# Patient Record
Sex: Female | Born: 2000 | Race: Black or African American | Hispanic: No | Smoking: Never smoker
Health system: Southern US, Community
[De-identification: ages and names within clinical notes are randomized; demographics above are authoritative.]

## PROBLEM LIST (undated history)

## (undated) ENCOUNTER — Inpatient Hospital Stay (HOSPITAL_COMMUNITY): Payer: Self-pay

## (undated) DIAGNOSIS — K219 Gastro-esophageal reflux disease without esophagitis: Secondary | ICD-10-CM

## (undated) DIAGNOSIS — J45909 Unspecified asthma, uncomplicated: Secondary | ICD-10-CM

## (undated) DIAGNOSIS — Z789 Other specified health status: Secondary | ICD-10-CM

## (undated) HISTORY — DX: Unspecified asthma, uncomplicated: J45.909

## (undated) HISTORY — PX: NO PAST SURGERIES: SHX2092

---
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
## 2020-02-07 ENCOUNTER — Other Ambulatory Visit: Payer: Medicaid Other

## 2020-02-07 ENCOUNTER — Other Ambulatory Visit: Payer: Self-pay | Admitting: Critical Care Medicine

## 2020-02-07 DIAGNOSIS — Z20822 Contact with and (suspected) exposure to covid-19: Secondary | ICD-10-CM

## 2020-02-09 ENCOUNTER — Telehealth: Payer: Self-pay | Admitting: *Deleted

## 2020-02-09 LAB — SARS-COV-2, NAA 2 DAY TAT

## 2020-02-09 LAB — NOVEL CORONAVIRUS, NAA: SARS-CoV-2, NAA: NOT DETECTED

## 2020-02-09 NOTE — Telephone Encounter (Signed)
Mother calling for COVID test result- notified negative. Duplicate chart- will request merge

## 2020-08-25 IMAGING — CT CT ABD-PELV W/ CM
2 of 4 series · 16 of 46 positions shown, 18 images · IV contrast (APPLIED)
Comparison: None.

CLINICAL DATA: Abdominal pain and non bilious vomiting

EXAM:
CT ABDOMEN AND PELVIS WITH CONTRAST
TECHNIQUE: Multidetector CT imaging of the abdomen and pelvis was performed
using the standard protocol following bolus administration of
intravenous contrast.
CONTRAST:  80mL OMNIPAQUE IOHEXOL 300 MG/ML  SOLN

[Series 3: abdomen 5.0 · axial · 0.73mm/px · z∈[+838,+1273]mm · 13 of 99 slices shown, 15 images]
[im 6/99  soft-tissue]
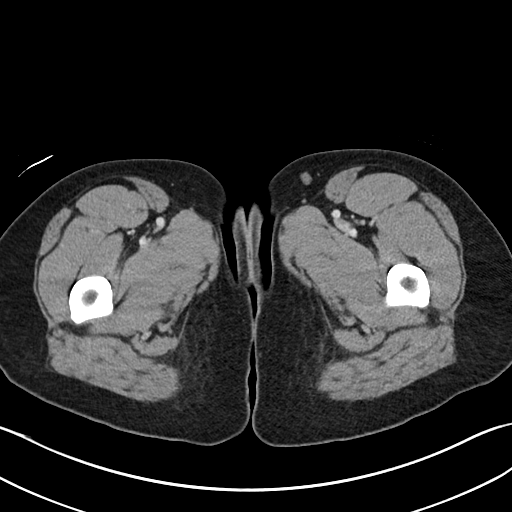
[im 6/99  bone]
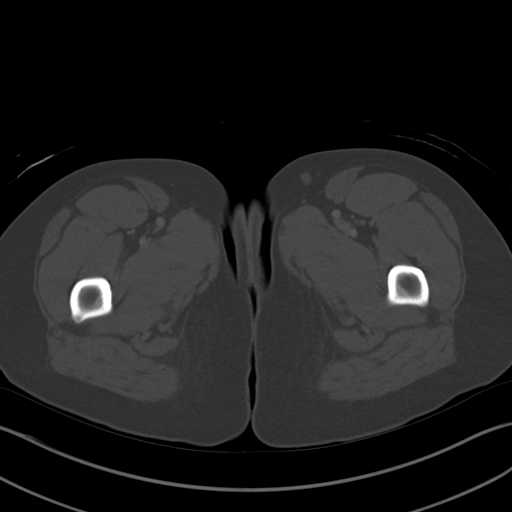
[im 16/99  soft-tissue]
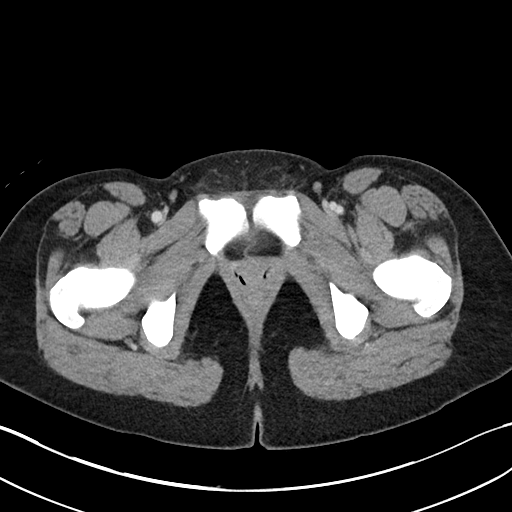
[im 21/99  soft-tissue]
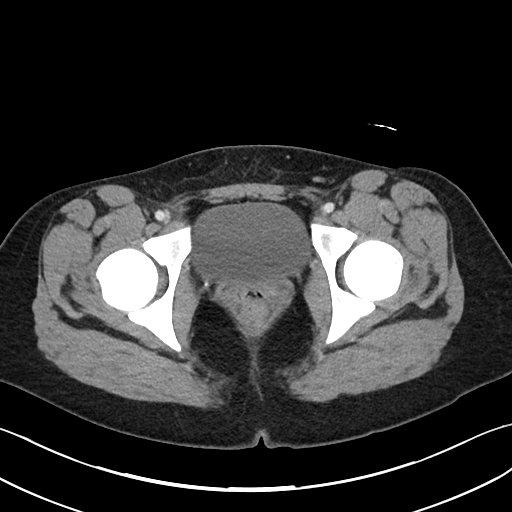
[im 26/99  soft-tissue]
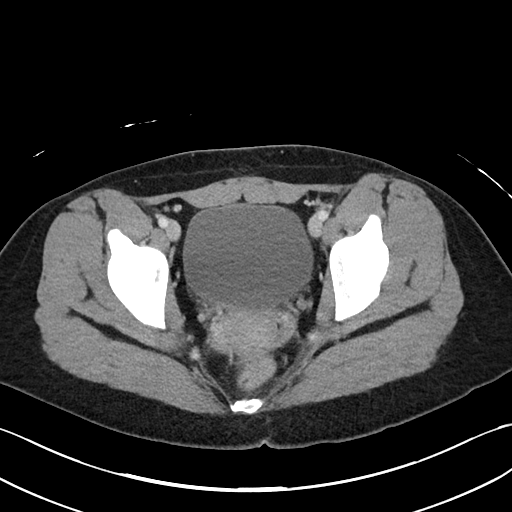
[im 37/99  soft-tissue]
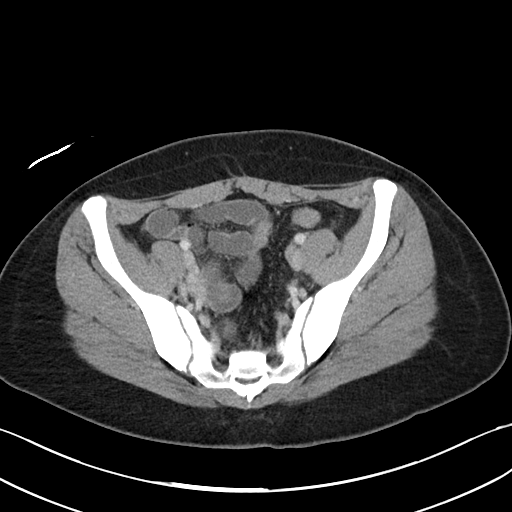
[im 42/99  soft-tissue]
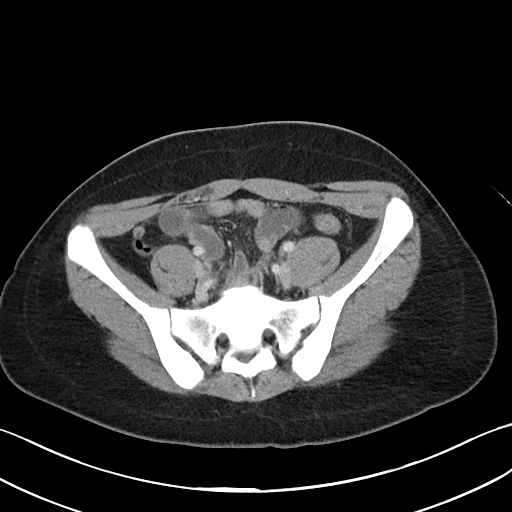
[im 52/99  soft-tissue]
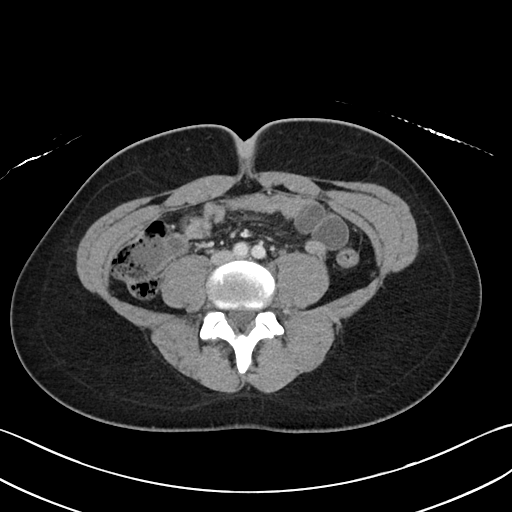
[im 57/99  soft-tissue]
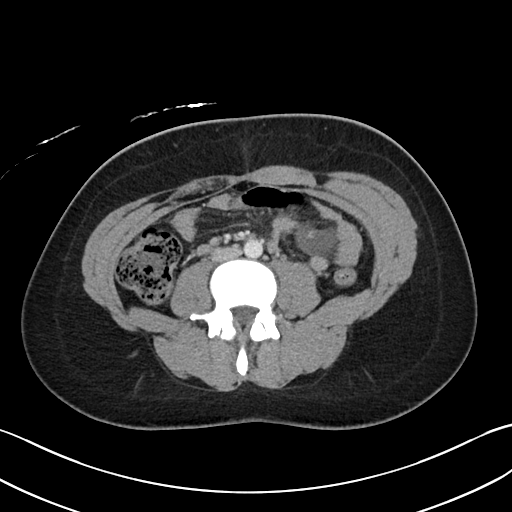
[im 62/99  soft-tissue]
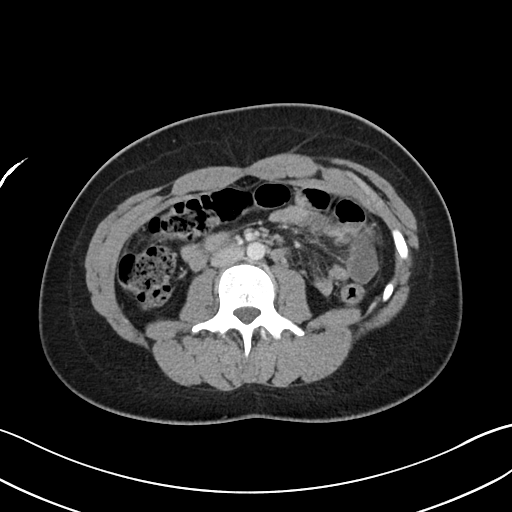
[im 62/99  bone]
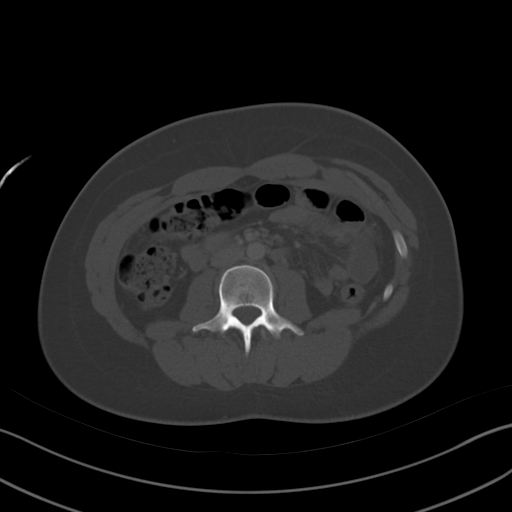
[im 73/99  soft-tissue]
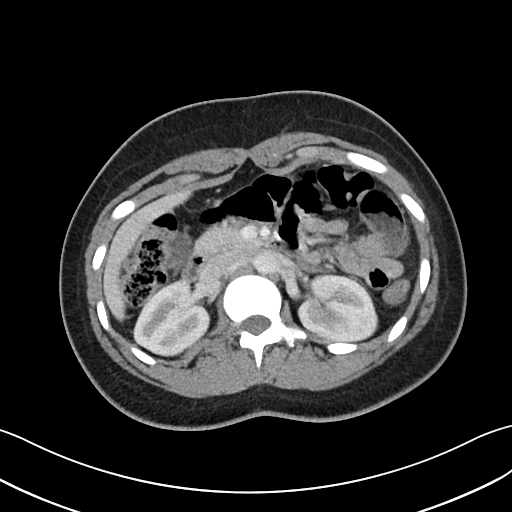
[im 78/99  soft-tissue]
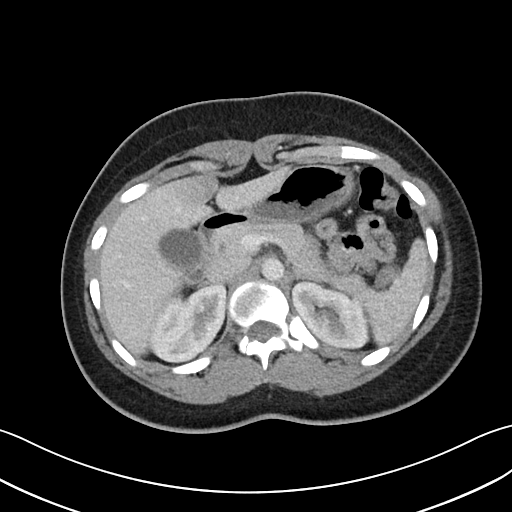
[im 83/99  soft-tissue]
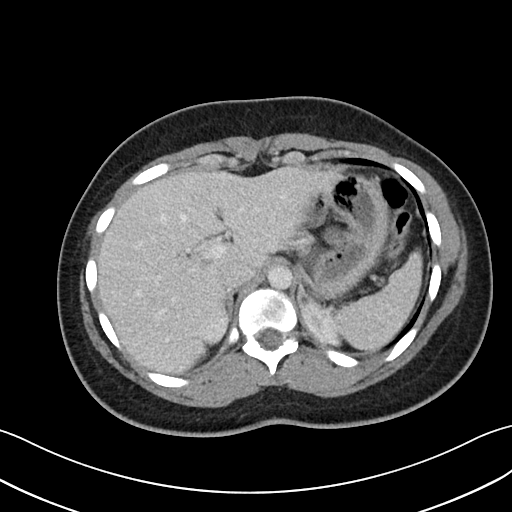
[im 93/99  soft-tissue]
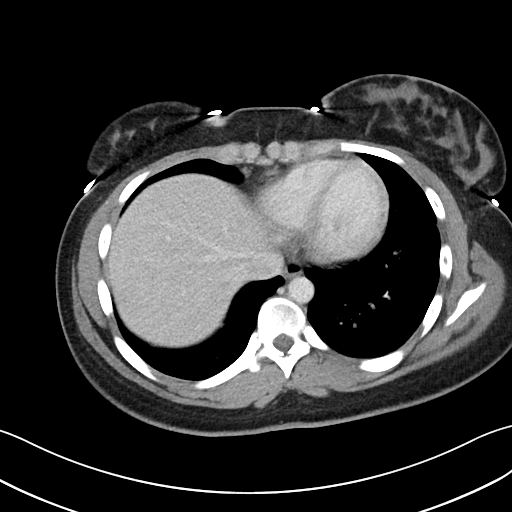

[Series 6: abdomen 3.0 mpr cor · coronal · 0.77mm/px · 3 of 93 slices shown]
[im 31/93  soft-tissue]
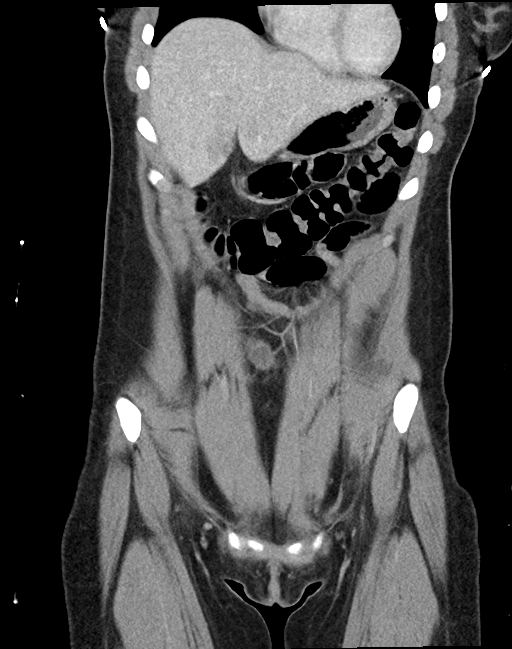
[im 41/93  soft-tissue]
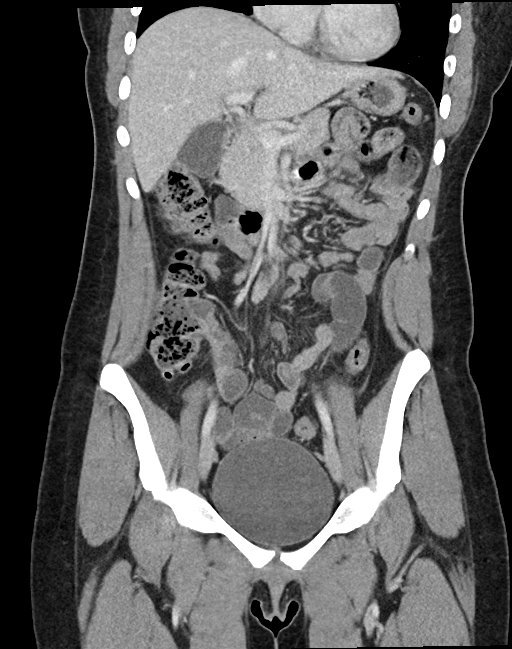
[im 52/93  soft-tissue]
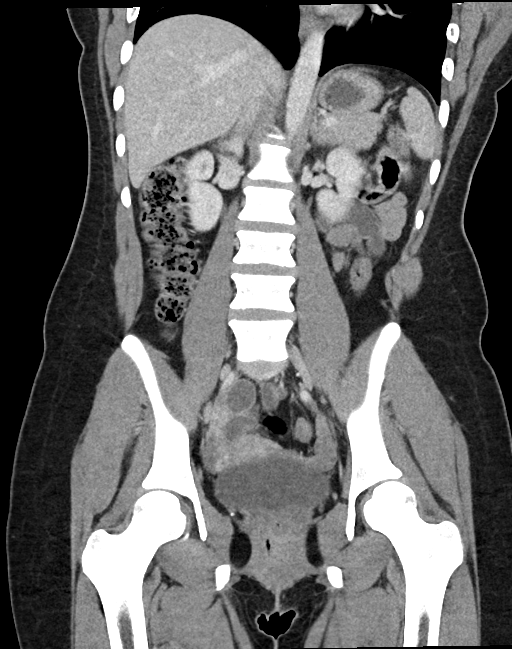

[16 of 46 positions shown; findings below may reference images not displayed]

FINDINGS: LOWER CHEST: Normal.

HEPATOBILIARY: Normal hepatic contours. No intra- or extrahepatic
biliary dilatation. Normal gallbladder.

PANCREAS: Normal pancreas. No ductal dilatation or peripancreatic
fluid collection.

SPLEEN: Normal.

ADRENALS/URINARY TRACT: The adrenal glands are normal. No
hydronephrosis, nephroureterolithiasis or solid renal mass. The
urinary bladder is normal for degree of distention

STOMACH/BOWEL: There is no hiatal hernia. Normal duodenal course and
caliber. No small bowel dilatation or inflammation. No focal colonic
abnormality. Normal appendix.

VASCULAR/LYMPHATIC: Normal course and caliber of the major abdominal
vessels. No abdominal or pelvic lymphadenopathy.

REPRODUCTIVE: Normal uterus and ovaries.

MUSCULOSKELETAL. Unilateral right L5 pars interarticularis defect.

OTHER: None.
IMPRESSION: 1. No acute abnormality of the abdomen or pelvis.
2. Unilateral right L5 pars interarticularis defect.

## 2023-04-16 DIAGNOSIS — N912 Amenorrhea, unspecified: Secondary | ICD-10-CM | POA: Diagnosis not present

## 2023-05-10 DIAGNOSIS — Z3200 Encounter for pregnancy test, result unknown: Secondary | ICD-10-CM | POA: Diagnosis not present

## 2023-05-11 ENCOUNTER — Telehealth: Payer: Self-pay | Admitting: Family Medicine

## 2023-05-11 NOTE — Telephone Encounter (Signed)
Patient called in requesting information on [employer],  she went to her OB yesterday and she will have them to fax over her [employer]. She just want to know the criteria to be apart of the program.

## 2023-05-31 ENCOUNTER — Inpatient Hospital Stay (HOSPITAL_COMMUNITY): Payer: Commercial Managed Care - PPO

## 2023-05-31 ENCOUNTER — Inpatient Hospital Stay (HOSPITAL_COMMUNITY)
Admission: AD | Admit: 2023-05-31 | Discharge: 2023-05-31 | Disposition: A | Discharge status: Home / Self Care | Payer: Commercial Managed Care - PPO | Attending: Obstetrics and Gynecology | Admitting: Obstetrics and Gynecology

## 2023-05-31 ENCOUNTER — Encounter (HOSPITAL_COMMUNITY): Payer: Self-pay | Admitting: Obstetrics and Gynecology

## 2023-05-31 ENCOUNTER — Other Ambulatory Visit: Payer: Self-pay

## 2023-05-31 DIAGNOSIS — O209 Hemorrhage in early pregnancy, unspecified: Secondary | ICD-10-CM | POA: Diagnosis not present

## 2023-05-31 DIAGNOSIS — Z3A11 11 weeks gestation of pregnancy: Secondary | ICD-10-CM | POA: Insufficient documentation

## 2023-05-31 DIAGNOSIS — R509 Fever, unspecified: Secondary | ICD-10-CM | POA: Diagnosis present

## 2023-05-31 DIAGNOSIS — R0781 Pleurodynia: Secondary | ICD-10-CM | POA: Diagnosis not present

## 2023-05-31 DIAGNOSIS — O99511 Diseases of the respiratory system complicating pregnancy, first trimester: Secondary | ICD-10-CM | POA: Diagnosis not present

## 2023-05-31 DIAGNOSIS — O26891 Other specified pregnancy related conditions, first trimester: Secondary | ICD-10-CM | POA: Insufficient documentation

## 2023-05-31 DIAGNOSIS — J101 Influenza due to other identified influenza virus with other respiratory manifestations: Secondary | ICD-10-CM | POA: Diagnosis not present

## 2023-05-31 HISTORY — DX: Other specified health status: Z78.9

## 2023-05-31 LAB — RESPIRATORY PANEL BY PCR

## 2023-05-31 LAB — RESP PANEL BY RT-PCR (RSV, FLU A&B, COVID)  RVPGX2
Influenza A by PCR: POSITIVE — AB
Influenza B by PCR: NEGATIVE
Resp Syncytial Virus by PCR: NEGATIVE
SARS Coronavirus 2 by RT PCR: NEGATIVE

## 2023-05-31 LAB — POCT PREGNANCY, URINE: Preg Test, Ur: POSITIVE — AB

## 2023-05-31 LAB — URINALYSIS, ROUTINE W REFLEX MICROSCOPIC
Bilirubin Urine: NEGATIVE
Glucose, UA: NEGATIVE mg/dL
Hgb urine dipstick: NEGATIVE
Ketones, ur: 5 mg/dL — AB
Leukocytes,Ua: NEGATIVE
Nitrite: NEGATIVE
Protein, ur: NEGATIVE mg/dL
Specific Gravity, Urine: 1.015 (ref 1.005–1.030)
pH: 5 (ref 5.0–8.0)

## 2023-05-31 MED ORDER — OSELTAMIVIR PHOSPHATE 75 MG PO CAPS: 75.0000 mg | ORAL_CAPSULE | Freq: Two times a day (BID) | ORAL | 0 refills | Status: AC

## 2023-05-31 MED ORDER — ACETAMINOPHEN 500 MG PO TABS
1000.0000 mg | ORAL_TABLET | Freq: Once | ORAL | Status: AC
  Administered 2023-05-31: 1000 mg via ORAL
  Filled 2023-05-31: qty 2

## 2023-05-31 MED ORDER — OSELTAMIVIR PHOSPHATE 75 MG PO CAPS: 75.0000 mg | ORAL_CAPSULE | Freq: Two times a day (BID) | ORAL | 0 refills | Status: DC

## 2023-05-31 NOTE — MAU Provider Note (Signed)
Chief Complaint: Fever  SUBJECTIVE HPI: Lauren Fernandez is a 22 y.o. G1P0 at [redacted]w[redacted]d by LMP who presents to maternity admissions reporting fever for two days with productive cough, pleuritic chest pain, malaise, myalgias and chills. Several sick contacts, works as Psychologist, occupational.  She denies vaginal bleeding, vaginal itching/burning, urinary symptoms, h/a, dizziness, n/v, or fever/chills.    HPI  Past Medical History:  Diagnosis Date   Medical history non-contributory    Past Surgical History:  Procedure Laterality Date   NO PAST SURGERIES     Social History   Socioeconomic History   Marital status: Significant Other    Spouse name: Not on file   Number of children: Not on file   Years of education: Not on file   Highest education level: Not on file  Occupational History   Not on file  Tobacco Use   Smoking status: Never   Smokeless tobacco: Never  Vaping Use   Vaping status: Former  Substance and Sexual Activity   Alcohol use: Not Currently   Drug use: Never   Sexual activity: Not on file  Other Topics Concern   Not on file  Social History Narrative   Not on file   Social Drivers of Health   Financial Resource Strain: Not on file  Food Insecurity: Not on file  Transportation Needs: Not on file  Physical Activity: Not on file  Stress: Not on file  Social Connections: Not on file  Intimate Partner Violence: Not on file   No current facility-administered medications on file prior to encounter.   Current Outpatient Medications on File Prior to Encounter  Medication Sig Dispense Refill   acetaminophen (TYLENOL) 650 MG CR tablet Take 650 mg by mouth every 8 (eight) hours as needed for pain.     Prenatal Vit-Fe Fumarate-FA (PRENATAL MULTIVITAMIN) TABS tablet Take 1 tablet by mouth daily at 12 noon.     doxycycline (VIBRAMYCIN) 100 MG capsule Take 1 capsule (100 mg total) by mouth 2 (two) times daily. (Patient not taking: Reported on 05/31/2023) 28 capsule  0   ibuprofen (ADVIL) 600 MG tablet Take 1 tablet (600 mg total) by mouth every 6 (six) hours as needed for fever or moderate pain. (Patient not taking: Reported on 05/31/2023) 30 tablet 0   ondansetron (ZOFRAN ODT) 4 MG disintegrating tablet Take 1 tablet (4 mg total) by mouth every 8 (eight) hours as needed for nausea or vomiting. (Patient not taking: Reported on 05/31/2023) 20 tablet 0   Pseudoeph-Doxylamine-DM-APAP (NYQUIL PO) Take by mouth. (Patient not taking: Reported on 05/31/2023)     No Known Allergies  I have reviewed patient's Past Medical Hx, Surgical Hx, Family Hx, Social Hx, medications and allergies.   ROS:  Review of Systems Review of Systems  Other systems negative   Physical Exam  Physical Exam Patient Vitals for the past 24 hrs:  BP Temp Temp src Pulse Resp SpO2 Height Weight  05/31/23 1206 -- 99.4 F (37.4 C) Oral -- (!) 105 -- -- --  05/31/23 1046 119/72 (!) 103.2 F (39.6 C) Oral (!) 106 20 98 % -- --  05/31/23 1039 -- -- -- -- -- -- 5\' 7"  (1.702 m) 82.5 kg   Constitutional: Well-developed, well-nourished female in no acute distress.  Cardiovascular: normal rate Respiratory: normal effort, CTAB with symmetric air entry  GI: Abd soft, non-tender. Pos BS x 4 MS: Extremities nontender, no edema, normal ROM Neurologic: Alert and oriented x 4.  GU: Neg CVAT.  LAB  RESULTS Results for orders placed or performed during the hospital encounter of 05/31/23 (from the past 24 hours)  Pregnancy, urine POC     Status: Abnormal   Collection Time: 05/31/23 10:53 AM  Result Value Ref Range   Preg Test, Ur POSITIVE (A) NEGATIVE  Resp panel by RT-PCR (RSV, Flu A&B, Covid) Anterior Nasal Swab     Status: Abnormal   Collection Time: 05/31/23 11:03 AM   Specimen: Anterior Nasal Swab  Result Value Ref Range   SARS Coronavirus 2 by RT PCR NEGATIVE NEGATIVE   Influenza A by PCR POSITIVE (A) NEGATIVE   Influenza B by PCR NEGATIVE NEGATIVE   Resp Syncytial Virus by PCR  NEGATIVE NEGATIVE  Urinalysis, Routine w reflex microscopic -Urine, Clean Catch     Status: Abnormal   Collection Time: 05/31/23 11:08 AM  Result Value Ref Range   Color, Urine YELLOW YELLOW   APPearance HAZY (A) CLEAR   Specific Gravity, Urine 1.015 1.005 - 1.030   pH 5.0 5.0 - 8.0   Glucose, UA NEGATIVE NEGATIVE mg/dL   Hgb urine dipstick NEGATIVE NEGATIVE   Bilirubin Urine NEGATIVE NEGATIVE   Ketones, ur 5 (A) NEGATIVE mg/dL   Protein, ur NEGATIVE NEGATIVE mg/dL   Nitrite NEGATIVE NEGATIVE   Leukocytes,Ua NEGATIVE NEGATIVE       IMAGING DG Chest Port 1V same Day Result Date: 05/31/2023 CLINICAL DATA:  22 year old female with pleuritic pain. EXAM: PORTABLE CHEST 1 VIEW COMPARISON:  Chest radiographs 07/06/2006. FINDINGS: Portable AP upright view at 1108 hours. Normal lung volumes and mediastinal contours. Visualized tracheal air column is within normal limits. Allowing for portable technique the lungs are clear. Negative visible bowel gas and osseous structures. IMPRESSION: Negative portable chest. Electronically Signed   By: Odessa Fleming M.D.   On: 05/31/2023 12:00    MAU Management/MDM: I have reviewed the triage vital signs and the nursing notes.   Pertinent labs & imaging results that were available during my care of the patient were reviewed by me and considered in my medical decision making (see chart for details).      I have reviewed her medical records including past results, notes and treatments. Medical, Surgical, and family history were reviewed.  Medications and recent lab tests were reviewed  Treatments in MAU included Tylenol, Respiratory panel swab, UA, UPT, CXR.   This bleeding/pain can represent a normal pregnancy with bleeding, spontaneous abortion or even an ectopic which can be life-threatening.  The process as listed above helps to determine which of these is present.  ASSESSMENT 1. [redacted] weeks gestation of pregnancy   2. Influenza A   Febrile on arrival, improved  with Tylenol. CXR negative on my review.  - Tylenol relived fever - Tamiflu sent to preferred pharmacy  PLAN Discharge home Return precautions provided Quarantine precautions provided  Pt stable at time of discharge. Encouraged to return here if she develops worsening of symptoms, increase in pain, fever, or other concerning symptoms.   Wyn Forster, MD FMOB Fellow, Faculty practice [facility] Center for [facility]  05/31/2023  12:10 PM

## 2023-05-31 NOTE — Discharge Instructions (Signed)

## 2023-05-31 NOTE — MAU Note (Signed)
Lauren Fernandez is a 22 y.o. at Unknown here in MAU reporting: she's had a fever for the past 2 days.  Reports she also has body aches, chills, productive cough, and chest pain when coughing.  States been taking Tylenol, last took one tablet around midnight.   Denies VB.   LMP: 03/11/2023 Onset of complaint: 2 days Pain score: 8 Vitals:   05/31/23 1046  BP: 119/72  Pulse: (!) 106  Resp: 20  Temp: (!) 103.2 F (39.6 C)  SpO2: 98%     FHT:NA Lab orders placed from triage: UPT

## 2023-06-02 NOTE — L&D Delivery Note (Signed)
 OB/GYN Faculty Practice Delivery Note  Lauren Fernandez is a 23 y.o. G1P0 s/p NSVD at [redacted]w[redacted]d. She was admitted for SROM.   ROM: 28h 27m with clear fluid GBS Status:  Negative/-- (07/02 1410) Maximum Maternal Temperature:  Temp (24hrs), Avg:98.3 F (36.8 C), Min:98 F (36.7 C), Max:98.4 F (36.9 C)    Labor Progress: Patient arrived at 3cm cm dilation and was induced with cytotec , and pitocin .   Delivery Date/Time: 12/13/2023 at 0905 Delivery: Called to room and patient was complete and pushing. Head delivered in LOP position. Loose nuchal cord present, pushed through. Shoulder and body delivered in usual fashion. Infant with spontaneous cry, placed on mother's abdomen, dried and stimulated. Cord clamped x 2 after 1-minute delay, and cut by FOB. Cord blood drawn. Placenta delivered spontaneously with gentle cord traction. Fundus firm with massage and Pitocin . Labia, perineum, vagina, and cervix inspected . Attending physician was present and supervised to the entire delivery.   Placenta: intact, sent to L&D Complications: none Lacerations: none  EBL: 100 Analgesia: epidural    Infant: APGAR (1 MIN): 9  APGAR (5 MINS): 9  APGAR (10 MINS):    Weight: pending  Chiquita Clover, MD  PGY-3 [facility] Family Medicine Resident 12/13/2023 10:20 AM

## 2023-06-03 ENCOUNTER — Telehealth: Payer: Commercial Managed Care - PPO

## 2023-06-03 DIAGNOSIS — Z3689 Encounter for other specified antenatal screening: Secondary | ICD-10-CM

## 2023-06-03 DIAGNOSIS — Z3490 Encounter for supervision of normal pregnancy, unspecified, unspecified trimester: Secondary | ICD-10-CM | POA: Insufficient documentation

## 2023-06-03 NOTE — Patient Instructions (Signed)
 Options for Doula Care in the [place]  As you review your birthing options, consider having a birth doula. A doula is trained to provide support before, during and just after you give birth. There are also postpartum doulas that help you adjust to new parenthood.  While doulas do not provide medical care, they do provide emotional, physical and educational support. A few months before your baby arrives, doulas can help answer questions, ease concerns and help you create and support your birthing plan.    Doulas can help reduce your stress and comfort you and your partner. They can help you cope with labor by helping you use breathing techniques, massage, creative labor positioning, essential oils and affirmations.   Studies show that the benefits of having a doula include:   A more positive birth experience  Fewer requests for pain-relief medication  Less likelihood of cesarean section, commonly called a c-section   Doulas are typically hired via a Advertising account planner between you and the doula. We are happy to provide a list of the most active doulas in the area, all of whom are credentialed by [facility] and will not count as a visitor at your birth.  There are several options for no-cost doula care at our hospital, including:  [facility] Volunteer Doula Program Every [employer] Program A Cure 4 Moms Doula Study (available only at [employer] for Women, [place], [place] and [facility] offices)  For more information on these programs or to receive a list of doulas active in our area, please email [email or web address]

## 2023-06-03 NOTE — Progress Notes (Signed)
 New OB Intake  I connected with Lauren Fernandez  on 06/03/23 at 2:35 by MyChart Video Visit and verified that I am speaking with the correct person using two identifiers. Nurse is located at [facility] and pt is located at home.  I discussed the limitations, risks, security and privacy concerns of performing an evaluation and management service by telephone and the availability of in person appointments. I also discussed with the patient that there may be a patient responsible charge related to this service. The patient expressed understanding and agreed to proceed.  I explained I am completing New OB Intake today. We discussed EDD of 12/16/2023, by Last Menstrual Period.She reports she had started care at [facility] and they had done US  and told her EDD 7/ 21/24. She reports she has asked them to send records. I verified we have not yet received records. She reports she will call them again.  Pt is G1P0. I reviewed her allergies, medications and Medical/Surgical/OB history.    Patient Active Problem List   Diagnosis Date Noted   Supervision of low-risk pregnancy 06/03/2023    Concerns addressed today  Delivery Plans Plans to deliver at [facility]. Discussed the nature of our practice with multiple providers including residents and students. Due to the size of the practice, the delivering provider may not be the same as those providing prenatal care.   Patient is interested in water  birth. Offered upcoming OB visit with CNM to discuss further.  MyChart/Babyscripts MyChart access verified. I explained pt will have some visits in office and some virtually. Babyscripts instructions given and order placed. Patient is a candidate for Optimized scheduling, added to sticky note.   Blood Pressure Cuff/Weight Scale Patient has private insurance; instructed to purchase blood pressure cuff and bring to first prenatal appt. Explained after first prenatal appt pt will check weekly and document in  Babyscripts. Patient does not have weight scale; patient may purchase if they desire to track weight weekly in Babyscripts.  Anatomy US  Explained first scheduled US  will be around 19 weeks. Anatomy US  scheduled for 07/23/23 at 0815.  Is patient a CenteringPregnancy candidate?  Declined Declined due to Enrolled in [facility]   Is patient a Mom+Baby Combined Care candidate?  Accepted   If accepted, confirm patient does not intend to move from the area for at least 12 months, then notify Mom+Baby staff  Interested in [place]? Yes, sent referral and doula dot phrase.   Is patient a candidate for Babyscripts Optimization? Yes, patient accepted    First visit review I reviewed new OB appt with patient. Explained pt will be seen by Nidia Delores PIETY at first visit. Discussed [place] genetic screening with patient. She would like both  Panorama and Horizon drawn with routine prenatal labs at new ob visit.    Last Pap No results found for: EDMON Rock Skip OBIE 06/03/2023  4:14 PM

## 2023-06-08 ENCOUNTER — Ambulatory Visit (INDEPENDENT_AMBULATORY_CARE_PROVIDER_SITE_OTHER): Payer: 59 | Admitting: Obstetrics and Gynecology

## 2023-06-08 ENCOUNTER — Encounter: Payer: Self-pay | Admitting: Family Medicine

## 2023-06-08 ENCOUNTER — Other Ambulatory Visit (HOSPITAL_COMMUNITY)
Admission: RE | Admit: 2023-06-08 | Discharge: 2023-06-08 | Disposition: A | Discharge status: Home / Self Care | Payer: Commercial Managed Care - PPO | Source: Ambulatory Visit | Attending: Obstetrics and Gynecology | Admitting: Obstetrics and Gynecology

## 2023-06-08 ENCOUNTER — Encounter: Payer: Self-pay | Admitting: Obstetrics and Gynecology

## 2023-06-08 VITALS — BP 97/68 | HR 65 | Wt 179.6 lb

## 2023-06-08 DIAGNOSIS — Z3492 Encounter for supervision of normal pregnancy, unspecified, second trimester: Secondary | ICD-10-CM | POA: Diagnosis not present

## 2023-06-08 DIAGNOSIS — Z3401 Encounter for supervision of normal first pregnancy, first trimester: Secondary | ICD-10-CM | POA: Diagnosis not present

## 2023-06-08 DIAGNOSIS — Z3A12 12 weeks gestation of pregnancy: Secondary | ICD-10-CM

## 2023-06-08 DIAGNOSIS — Z124 Encounter for screening for malignant neoplasm of cervix: Secondary | ICD-10-CM

## 2023-06-08 MED ORDER — ASPIRIN 81 MG PO TBEC: 81.0000 mg | DELAYED_RELEASE_TABLET | Freq: Every day | ORAL | 2 refills | Status: DC

## 2023-06-08 MED ORDER — VITAFOL GUMMIES 3.33-0.333-34.8 MG PO CHEW: 1.0000 | CHEWABLE_TABLET | Freq: Every day | ORAL | 5 refills | Status: AC

## 2023-06-08 NOTE — Progress Notes (Signed)
 INITIAL PRENATAL VISIT  Subjective:   Lauren Fernandez is being seen today for her first obstetrical visit.  She is at [redacted]w[redacted]d gestation by LMP Her obstetrical history is significant for  none . Relationship with FOB: significant other, living together. Patient does intend to breast feed. Pregnancy history fully reviewed.  Patient reports no complaints.  Objective:    Obstetric History OB History  Gravida Para Term Preterm AB Living  1       SAB IAB Ectopic Multiple Live Births          # Outcome Date GA Lbr Len/2nd Weight Sex Type Anes PTL Lv  1 Current             Past Medical History:  Diagnosis Date   Asthma    as a child   Chlamydia 2018   Gonorrhea 2018   Medical history non-contributory     Past Surgical History:  Procedure Laterality Date   NO PAST SURGERIES      Current Outpatient Medications on File Prior to Visit  Medication Sig Dispense Refill   Prenatal Vit-Fe Fumarate-FA (PRENATAL MULTIVITAMIN) TABS tablet Take 1 tablet by mouth daily at 12 noon.     acetaminophen  (TYLENOL ) 650 MG CR tablet Take 650 mg by mouth every 8 (eight) hours as needed for pain. (Patient not taking: Reported on 06/08/2023)     ondansetron  (ZOFRAN  ODT) 4 MG disintegrating tablet Take 1 tablet (4 mg total) by mouth every 8 (eight) hours as needed for nausea or vomiting. (Patient not taking: Reported on 06/08/2023) 20 tablet 0   Pseudoeph-Doxylamine-DM-APAP (NYQUIL PO) Take by mouth. (Patient not taking: Reported on 05/31/2023)     No current facility-administered medications on file prior to visit.    No Known Allergies  Social History:  reports that she has never smoked. She has never used smokeless tobacco. She reports that she does not currently use alcohol. She reports that she does not use drugs.  History reviewed. No pertinent family history.  The following portions of the patient's history were reviewed and updated as appropriate: allergies, current medications, past family  history, past medical history, past social history, past surgical history and problem list.  Review of Systems Review of Systems  All other systems reviewed and are negative.    Physical Exam:  BP 97/68   Pulse 65   Wt 179 lb 9.6 oz (81.5 kg)   LMP 03/11/2023   BMI 28.13 kg/m  CONSTITUTIONAL: Well-developed, well-nourished female in no acute distress.  HENT:  Normocephalic, atraumatic.   EYES: Conjunctivae normal.  NECK: Normal range of motion, supple SKIN: Skin is warm and dry MUSCULOSKELETAL: Normal range of motion NEUROLOGIC: Alert and oriented  PSYCHIATRIC: Normal mood and affect. Normal behavior. Normal judgment and thought content. CARDIOVASCULAR: Normal heart rate noted RESPIRATORY: normal effort ABDOMEN: Soft PELVIC: Normal appearing external genitalia; normal appearing vaginal mucosa and cervix.  No abnormal discharge noted.  Pap smear obtained.  Normal uterine size, no other palpable masses, no uterine or adnexal tenderness.  Fetal Heart Rate (bpm): 156   Movement: Absent       Assessment:    Pregnancy: G1P0   1. Encounter for supervision of low-risk pregnancy in second trimester (Primary) BP and FHR normal Doing well overall  - Hemoglobin A1c - CBC/D/Plt+RPR+Rh+ABO+RubIgG... - Culture, OB Urine - Prenatal Vit-Fe Phos-FA-Omega (VITAFOL  GUMMIES) 3.33-0.333-34.8 MG CHEW; Chew 1 tablet by mouth daily.  Dispense: 90 tablet; Refill: 5  2. [redacted] weeks gestation of pregnancy Discussed  ASA recommendation in pregnancy, rx sent Interested in waterbirth, given information about class, and meeting with waterbirth provider, as well as staying low risk Given information about childbirth education options and doula, had a doula call her this morning as well Discussed babyscripts optimized scheduling   3. Cervical cancer screening  - Cytology - PAP( [place])  4. Encounter for supervision of low-risk first pregnancy in first trimester  - HORIZON Basic Panel -  PANORAMA PRENATAL TEST    Plan:     Initial labs drawn. Prenatal vitamins. Problem list reviewed and updated. Reviewed in detail the nature of the practice with collaborative care between  Genetic screening discussed: NIPS/First trimester screen/Quad/AFP ordered. Role of ultrasound in pregnancy discussed; Anatomy US : ordered. Follow up in 4 weeks. Discussed clinic routines, schedule of care and testing, genetic screening options, involvement of students and residents under the direct supervision of APPs and doctors and presence of female providers. Pt verbalized understanding.  Return in 8 weeks for [facility] optimized schedule Future Appointments  Date Time Provider Department Center  07/23/2023  8:15 AM [facility] NURSE [facility]  07/23/2023  8:30 AM [facility] US5 [facility]      Delores Nidia CROME, FNP

## 2023-06-08 NOTE — Patient Instructions (Signed)
We highly recommend childbirth education to help you plan for labor and begin practicing coping skills (which will be needed with or without pain meds).  [place] Childbirth Education Options: Sign up by visiting [email or web address]  Childbirth ~ Self-Paced eClass (English and Spanish) This online class offers you the freedom to complete a childbirth education series in the comfort of your own home at your own pace.  Childbirth Class (In-Person 4-Week Series  or on Saturdays, Virtual 4-Week Series ~ [place]) This interactive in-person class series will help you and your partner prepare for your birth experience. Topics include: Labor & Birth, Comfort Measures, Breathing Techniques, Massage, Medical Interventions, Pain Management Options, Cesarean Birth, Postpartum Care, and Newborn Care  Comfort Techniques for Labor ~ In-Person Class ([place]) This interactive class is designed for parents-to-be who want to learn & practice hands-on skills to help relieve some of the discomfort of labor and encourage their babies to rotate toward the best position for birth. Moms and their partners will be able to try a variety of labor positions with birth balls and rebozos as well as practice breathing, relaxation, and visualization techniques.  Natural Childbirth Class (In-Person 5-Week Series, In-Person on Saturdays or Virtual 5-Week Series ~ [place]) This class series is designed for expectant parents who want to learn and practice natural methods of coping with the process of labor and childbirth.  Cesarean Birth Self-Paced eClass (English and Spanish) This online course provides comprehensive information you can trust as you prepare for a possible cesarean birth. In this class, you'll learn how to make your birth and recovery comfortable and joyful through instructive video clips, animations, and activities.  Waterbirth ~ Virtual Class Interested in a waterbirth? In addition to a consultation  with your credentialed waterbirth provider, this free, informational online class will help you discover whether waterbirth is the right fit for you. Not all obstetrical practices offer waterbirth, so check with your healthcare provider.  Tour (Self-Paced Video) - [facility] [place] [facility] Watch our 4 minute video tour of [facility] located in [place].   [place] Parenting Education Options:  Pregnancy 101 (Virtual) Congratulations on your pregnancy! This class is geared toward moms in their first trimester, but everyone is welcome. We are excited to guide you through all aspects of supporting a healthy pregnancy. You will learn what to expect at routine prenatal care appointments, common postpartum adjustments, basic infant safety, and breastfeeding.  Successful Partnering & Parenting ~ In-Person Workshop ([place]) This workshop inspires and equips partners of all economic levels, ages, and cultures to confidently care for their infants, support the birthing persons, and navigate their own transformations into new partners and parents. Learning activities are geared towards supporting partner, but moms are welcome to attend.  'Baby & Me' Parenting Group (Virtual on Wednesdays at 11am) Enjoy this time discussing newborn & infant parenting topics and family adjustment issues with other new parents in a relaxed environment. Each week brings a new speaker or baby-centered activity. This group offers support and connection to parents as they journey through the adjustments and struggles of that sometimes overwhelming first year after the birth of a child.  Baby Safety, CPR, & Choking Class ~ Virtual This life-saving information is meant to encourage parents as they learn important safety and prevention tips as well as infant CPR and relief of choking.  Breastfeeding Class (In-Person in [place] or Virtual) Families learn what to expect in the  first days and weeks of breastfeeding your   newborn. IF YOU ARE AN EMPLOYEE TAKING THIS CLASS FOR CREDIT, DO NOT register yourself. Please e-mail [email or web address].   Breastfeeding Self-Paced eClass (English & Spanish) Families learn what to expect in the first days and weeks of breastfeeding your newborn.  Caring for Baby ~ In-Person, Virtual or Self-Paced Class This in-person class is for both expectant and adoptive parents who want to learn and practice the most up-to-date newborn care for their babies. Focus is on birth through the first six weeks of life.  CPR & Choking Relief for Infants & Children ~ In-Person Class ([place]) This in-person course is designed for any parent, expectant parent, or adult who cares for infants or children. Participants learn and demonstrate cardiopulmonary resuscitation and choking relief procedures for both infants and children.  Grandparent Love ~ In-Person Class Grandparents will learn the most updated infant care and safety recommendations. They will discover ways to support their own children during the transition into the parenting role and receive tips on communicating with the new parents.  [place] Parenting Support Group Options:  Bereavement Grief Support Group (Pregnancy/Infant Loss) - Virtual This is an ongoing experience that meets once a month and is designed to help you honor the past, assist you in discovering tools to strengthen you today, and aid you in developing hope for the future.  Breastfeeding & Pumping Support Group (In-Person on Thursdays at 12pm or Virtual on Tuesdays at 5pm) Join us in-person each Thursday starting June 1st, 2023 at 12pm! This support group is free for all families looking for breastfeeding and/or pumping support.   Community-Based Childbirth Education Options:  [facility] Classes:  Childbirth education classes can help you get ready for a positive parenting experience. You  can also meet other expectant parents and get free stuff for your baby. Each class runs for five weeks on the same night and costs $45 for the mother-to-be and her support person. Medicaid covers the cost if you are eligible. Call [phone] to register.  [employer] [facility] [employer] offers a variety of programs for the [place] community and is another great way to get connected. Please go to [email or web address] for more information.  Childbirth With A Twist! Be informed of your options, get educated on birth, understand what your body is doing, learn how to cope, and have a lot of fun and laughs all while doing it either from the comfort of your couch OR in our cozy office and classroom space near the [place] airport. If you are taking a virtual class, then class is taught LIVE, so you can ask questions and receive answers in real-time from an experienced doula and childbirth educator.  This virtual childbirth education class will meet for five instruction times online.  Although we are based in , [place], this virtual class is open to anyone in the world. Please visit: [email or web address] for more information.  Books We Love: The Doula Guide to Childbirth by Ananda Lowe and Rachel Zimmerman The First-Time Parent's Childbirth Handbook by Dr. Stephanie [place], CNM The Birth Partner by Penny Simkin    Considering Waterbirth? Guide for patients at [facility] ([facility]) Why consider waterbirth? Gentle birth for babies  Less pain medicine used in labor  May allow for passive descent/less pushing  May reduce perineal tears  More mobility and instinctive maternal position changes  Increased maternal relaxation   Is waterbirth safe? What are the risks of infection, drowning or other complications? Infection:  Very low risk (3.7 % for tub vs   4.8% for bed)  7 in 8000 waterbirths with documented infection  Poorly cleaned equipment most  common cause  Slightly lower group B strep transmission rate  Drowning  Maternal:  Very low risk  Related to seizures or fainting  Newborn:  Very low risk. No evidence of increased risk of respiratory problems in multiple large studies  Physiological protection from breathing under water  Avoid underwater birth if there are any fetal complications  Once baby's head is out of the water, keep it out.  Birth complication  Some reports of cord trauma, but risk decreased by bringing baby to surface gradually  No evidence of increased risk of shoulder dystocia. Mothers can usually change positions faster in water than in a bed, possibly aiding the maneuvers to free the shoulder.   There are 2 things you MUST do to have a waterbirth with [facility]: Attend a waterbirth class at [facility]   3rd Wednesday of every month from 7-9 pm (virtual during COVID) Free Register online at [email or web address] or [email or web address] or by calling [phone] Bring us the certificate from the class to your prenatal appointment or send via MyChart Meet with a midwife at 36 weeks* to see if you can still plan a waterbirth and to sign the consent.   *We also recommend that you schedule as many of your prenatal visits with a midwife as possible.    Helpful information: You may want to bring a bathing suit top to the hospital to wear during labor but this is optional.  All other supplies are provided by the hospital. Please arrive at the hospital with signs of active labor, and do not wait at home until late in labor. It takes 45 min- 1 hour for fetal monitoring, and check in to your room to take place, plus transport and filling of the waterbirth tub.    Things that would prevent you from having a waterbirth: Premature, <37wks  Previous cesarean birth  Presence of thick meconium-stained fluid  Multiple gestation (Twins, triplets, etc.)  Uncontrolled diabetes or gestational  diabetes requiring medication  Hypertension diagnosed in pregnancy or preexisting hypertension (gestational hypertension, preeclampsia, or chronic hypertension) Fetal growth restriction (your baby measures less than 10th percentile on ultrasound) Heavy vaginal bleeding  Non-reassuring fetal heart rate  Active infection (MRSA, etc.). Group B Strep is NOT a contraindication for waterbirth.  If your labor has to be induced and induction method requires continuous monitoring of the baby's heart rate  Other risks/issues identified by your obstetrical provider   Please remember that birth is unpredictable. Under certain unforeseeable circumstances your provider may advise against giving birth in the tub. These decisions will be made on a case-by-case basis and with the safety of you and your baby as our highest priority.    Updated 09/03/21   

## 2023-06-09 ENCOUNTER — Encounter: Payer: Self-pay | Admitting: *Deleted

## 2023-06-09 LAB — CBC/D/PLT+RPR+RH+ABO+RUBIGG...
Antibody Screen: NEGATIVE
Basophils Absolute: 0 10*3/uL (ref 0.0–0.2)
Basos: 0 %
EOS (ABSOLUTE): 0.1 10*3/uL (ref 0.0–0.4)
Eos: 2 %
HCV Ab: NONREACTIVE
HIV Screen 4th Generation wRfx: NONREACTIVE
Hematocrit: 38.8 % (ref 34.0–46.6)
Hemoglobin: 12.7 g/dL (ref 11.1–15.9)
Hepatitis B Surface Ag: NEGATIVE
Immature Grans (Abs): 0 10*3/uL (ref 0.0–0.1)
Immature Granulocytes: 0 %
Lymphocytes Absolute: 2 10*3/uL (ref 0.7–3.1)
Lymphs: 38 %
MCH: 27.2 pg (ref 26.6–33.0)
MCHC: 32.7 g/dL (ref 31.5–35.7)
MCV: 83 fL (ref 79–97)
Monocytes Absolute: 0.5 10*3/uL (ref 0.1–0.9)
Monocytes: 9 %
Neutrophils Absolute: 2.6 10*3/uL (ref 1.4–7.0)
Neutrophils: 51 %
Platelets: 286 10*3/uL (ref 150–450)
RBC: 4.67 x10E6/uL (ref 3.77–5.28)
RDW: 12.1 % (ref 11.7–15.4)
RPR Ser Ql: NONREACTIVE
Rh Factor: POSITIVE
Rubella Antibodies, IGG: 2.35 {index} (ref >0.99)
WBC: 5.1 10*3/uL (ref 3.4–10.8)

## 2023-06-09 LAB — CYTOLOGY - PAP
Chlamydia: NEGATIVE
Comment: NEGATIVE
Comment: NEGATIVE
Comment: NORMAL
Diagnosis: NEGATIVE
Neisseria Gonorrhea: NEGATIVE
Trichomonas: NEGATIVE

## 2023-06-09 LAB — HEMOGLOBIN A1C
Est. average glucose Bld gHb Est-mCnc: 111 mg/dL
Hgb A1c MFr Bld: 5.5 % (ref 4.8–5.6)

## 2023-06-09 LAB — HCV INTERPRETATION

## 2023-06-15 LAB — PANORAMA PRENATAL TEST FULL PANEL:PANORAMA TEST PLUS 5 ADDITIONAL MICRODELETIONS: FETAL FRACTION: 7.1

## 2023-06-18 LAB — HORIZON CUSTOM: REPORT SUMMARY: POSITIVE — AB

## 2023-07-09 ENCOUNTER — Other Ambulatory Visit: Payer: Self-pay

## 2023-07-09 ENCOUNTER — Ambulatory Visit (INDEPENDENT_AMBULATORY_CARE_PROVIDER_SITE_OTHER): Payer: 59 | Admitting: Obstetrics and Gynecology

## 2023-07-09 VITALS — BP 117/75 | HR 90 | Wt 189.0 lb

## 2023-07-09 DIAGNOSIS — Z3492 Encounter for supervision of normal pregnancy, unspecified, second trimester: Secondary | ICD-10-CM | POA: Diagnosis not present

## 2023-07-09 DIAGNOSIS — D563 Thalassemia minor: Secondary | ICD-10-CM | POA: Insufficient documentation

## 2023-07-09 DIAGNOSIS — Z3A17 17 weeks gestation of pregnancy: Secondary | ICD-10-CM | POA: Diagnosis not present

## 2023-07-09 NOTE — Progress Notes (Signed)
   Subjective:  Lauren Fernandez is a 23 y.o. G1P0 at [redacted]w[redacted]d being seen today for ongoing prenatal care.  She is currently monitored for the following issues for this low-risk pregnancy and has Supervision of low-risk pregnancy and Alpha thalassemia silent carrier on their problem list.  Patient reports no complaints.  Contractions: Not present. Vag. Bleeding: None.  Movement: Present. Denies leaking of fluid.   The following portions of the patient's history were reviewed and updated as appropriate: allergies, current medications, past family history, past medical history, past social history, past surgical history and problem list. Problem list updated.  Objective:   Vitals:   07/09/23 0941  BP: 117/75  Pulse: 90  Weight: 189 lb (85.7 kg)    Fetal Status: Fetal Heart Rate (bpm): 140   Movement: Present     General:  Alert, oriented and cooperative. Patient is in no acute distress.  Skin: Skin is warm and dry. No rash noted.   Cardiovascular: Normal heart rate noted  Respiratory: Normal respiratory effort, no problems with respiration noted  Abdomen: Soft, gravid, appropriate for gestational age. Pain/Pressure: Absent     Pelvic: Vag. Bleeding: None     Cervical exam deferred        Extremities: Normal range of motion.  Edema: None  Mental Status: Normal mood and affect. Normal behavior. Normal judgment and thought content.   Urinalysis:      Assessment and Plan:  Pregnancy: G1P0 at [redacted]w[redacted]d  1. Encounter for supervision of low-risk pregnancy in second trimester BP and FHR normal Doing well, starting to feel somemovement    2. [redacted] weeks gestation of pregnancy (Primary) Discussed AFP today  Waterbirth information provided, will schedule with [place] at another visit  Anatomy scan 2/21  3. Alpha thalassemia silent carrier Discussed results, providing partner kit today    Preterm labor symptoms and general obstetric precautions including but not limited to vaginal bleeding,  contractions, leaking of fluid and fetal movement were reviewed in detail with the patient. Please refer to After Visit Summary for other counseling recommendations.  Return in about 4 weeks (around 08/06/2023) for Mom+baby comined care OB visit.  Future Appointments  Date Time Provider Department Center  07/23/2023  8:15 AM [facility] NURSE [facility]  07/23/2023  8:30 AM [facility]  08/06/2023  9:55 AM Cresenzo, Norleen GAILS, MD [facility]    Delores Nidia CROME, FNP

## 2023-07-09 NOTE — Patient Instructions (Addendum)
 Considering Waterbirth? Guide for patients at [facility]) Why consider waterbirth? Gentle birth for babies  Less pain medicine used in labor  May allow for passive descent/less pushing  May reduce perineal tears  More mobility and instinctive maternal position changes  Increased maternal relaxation   Is waterbirth safe? What are the risks of infection, drowning or other complications? Infection:  Very low risk (3.7 % for tub vs 4.8% for bed)  7 in 8000 waterbirths with documented infection  Poorly cleaned equipment most common cause  Slightly lower group B strep transmission rate  Drowning  Maternal:  Very low risk  Related to seizures or fainting  Newborn:  Very low risk. No evidence of increased risk of respiratory problems in multiple large studies  Physiological protection from breathing under water   Avoid underwater birth if there are any fetal complications  Once baby's head is out of the water , keep it out.  Birth complication  Some reports of cord trauma, but risk decreased by bringing baby to surface gradually  No evidence of increased risk of shoulder dystocia. Mothers can usually change positions faster in water  than in a bed, possibly aiding the maneuvers to free the shoulder.   There are 2 things you MUST do to have a waterbirth with [facility]: Attend a waterbirth class at [facility] [employer] [facility] at [facility]   3rd Wednesday of every month from 7-9 pm (virtual during COVID) [employer] at [email or web address] or [email or web address] or by calling [phone] Bring us  the certificate from the class to your prenatal appointment or send via MyChart Meet with a midwife at 36 weeks* to see if you can still plan a waterbirth and to sign the consent.   *We also recommend that you schedule as many of your prenatal visits with a midwife as possible.    Helpful information: You may want to bring a bathing suit top to the hospital  to wear during labor but this is optional.  All other supplies are provided by the hospital. Please arrive at the hospital with signs of active labor, and do not wait at home until late in labor. It takes 45 min- 1 hour for fetal monitoring, and check in to your room to take place, plus transport and filling of the waterbirth tub.    Things that would prevent you from having a waterbirth: Premature, <37wks  Previous cesarean birth  Presence of thick meconium-stained fluid  Multiple gestation (Twins, triplets, etc.)  Uncontrolled diabetes or gestational diabetes requiring medication  Hypertension diagnosed in pregnancy or preexisting hypertension (gestational hypertension, preeclampsia, or chronic hypertension) Fetal growth restriction (your baby measures less than 10th percentile on ultrasound) Heavy vaginal bleeding  Non-reassuring fetal heart rate  Active infection (MRSA, etc.). Group B Strep is NOT a contraindication for waterbirth.  If your labor has to be induced and induction method requires continuous monitoring of the baby's heart rate  Other risks/issues identified by your obstetrical provider   Please remember that birth is unpredictable. Under certain unforeseeable circumstances your provider may advise against giving birth in the tub. These decisions will be made on a case-by-case basis and with the safety of you and your baby as our highest priority.    Updated 09/03/21 We highly recommend childbirth education to help you plan for labor and begin practicing coping skills (which will be needed with or without pain meds).  [place] Childbirth Education Options: Sign up by visiting [email or web address]  Childbirth ~ Self-Paced eClass (English and  Spanish) This online class offers you the freedom to complete a childbirth education series in the comfort of your own home at your own pace.  Childbirth Class (In-Person 4-Week Series  or on Saturdays, Virtual 4-Week Series ~  [place]) This interactive in-person class series will help you and your partner prepare for your birth experience. Topics include: Labor & Birth, Comfort Measures, Breathing Techniques, Massage, Medical Interventions, Pain Management Options, Cesarean Birth, Postpartum Care, and Newborn Care  Comfort Techniques for Labor ~ In-Person Class [facility]) This interactive class is designed for parents-to-be who want to learn & practice hands-on skills to help relieve some of the discomfort of labor and encourage their babies to rotate toward the best position for birth. Moms and their partners will be able to try a variety of labor positions with birth balls and rebozos as well as practice breathing, relaxation, and visualization techniques.  Natural Childbirth Class (In-Person 5-Week Series, In-Person on Saturdays or Virtual 5-Week Series ~ [place]) This class series is designed for expectant parents who want to learn and practice natural methods of coping with the process of labor and childbirth.  Cesarean Birth Self-Paced eClass (English and Spanish) This online course provides comprehensive information you can trust as you prepare for a possible cesarean birth. In this class, you'll learn how to make your birth and recovery comfortable and joyful through instructive video clips, animations, and activities.  Waterbirth ~ Airline Pilot Interested in a waterbirth? In addition to a consultation with your credentialed waterbirth provider, this free, informational online class will help you discover whether waterbirth is the right fit for you. Not all obstetrical practices offer waterbirth, so check with your healthcare provider.  Tour Probation Officer) - [facility] [employer] our 4 minute video tour of [facility] located in [place].   [place] Parenting Education Options:  Pregnancy 101 (Virtual) Congratulations on your pregnancy!  This class is geared toward moms in their first trimester, but everyone is welcome. We are excited to guide you through all aspects of supporting a healthy pregnancy. You will learn what to expect at routine prenatal care appointments, common postpartum adjustments, basic infant safety, and breastfeeding.  Successful Partnering & Parenting ~ In-Person Workshop [facility]) This workshop inspires and equips partners of all economic levels, ages, and cultures to confidently care for their infants, support the birthing persons, and navigate their own transformations into new partners and parents. Learning activities are geared towards supporting partner, but moms are welcome to attend.  'Baby & Me' Parenting Group (Virtual on Wednesdays at 11am) Enjoy this time discussing newborn & infant parenting topics and family adjustment issues with other new parents in a relaxed environment. Each week brings a new speaker or baby-centered activity. This group offers support and connection to parents as they journey through the adjustments and struggles of that sometimes overwhelming first year after the birth of a child.  Baby Safety, CPR, & Choking Class ~ Virtual This life-saving information is meant to encourage parents as they learn important safety and prevention tips as well as infant CPR and relief of choking.  Breastfeeding Class (In-Person in [place] or [employer]) Families learn what to expect in the first days and weeks of breastfeeding your newborn. IF YOU ARE AN EMPLOYEE TAKING THIS CLASS FOR CREDIT, DO NOT register yourself. Please e-mail [email or web address].   Breastfeeding Self-Paced eClass (English & Spanish) Families learn what to expect in the first days and weeks of breastfeeding your newborn.  Caring  for Baby ~ In-Person, Virtual or Self-Paced Class This in-person class is for both expectant and adoptive parents who want to learn and practice the most up-to-date newborn care for their  babies. Focus is on birth through the first six weeks of life.  CPR & Choking Relief for Infants & Children ~ In-Person Class [facility]) This in-person course is designed for any parent, expectant parent, or adult who cares for infants or children. Participants learn and demonstrate cardiopulmonary resuscitation and choking relief procedures for both infants and children.  Grandparent Love ~ In-Person Class Grandparents will learn the most updated infant care and safety recommendations. They will discover ways to support their own children during the transition into the parenting role and receive tips on communicating with the new parents.  [place] Parenting Support Group Options:  Bereavement Grief Support Group (Pregnancy/Infant Loss) - Virtual This is an ongoing experience that meets once a month and is designed to help you honor the past, assist you in discovering tools to strengthen you today, and aid you in developing hope for the future.  Breastfeeding & Pumping Support Group (In-Person on Thursdays at 12pm or Virtual on Tuesdays at 5pm) Join us  in-person each Thursday starting June 1st, 2023 at 12pm! This support group is free for all families looking for breastfeeding and/or pumping support.   Community-Based Childbirth Education Options:  [facility] Department Classes:  Childbirth education classes can help you get ready for a positive parenting experience. You can also meet other expectant parents and get free stuff for your baby. Each class runs for five weeks on the same night and costs $45 for the mother-to-be and her support person. Medicaid covers the cost if you are eligible. Call [phone] to register.  [facility] [employer] offers a variety of programs for the [employer] and is another great way to get connected. Please go to [email or web address] for more information.  Childbirth With A Twist! Be informed of your options, get  educated on birth, understand what your body is doing, learn how to cope, and have a lot of fun and laughs all while doing it either from the comfort of your couch OR in our cozy office and classroom space near the [place] airport. If you are taking a virtual class, then class is taught LIVE, so you can ask questions and receive answers in real-time from an experienced doula and childbirth educator.  This virtual childbirth education class will meet for five instruction times online.  Although we are based in [place], [place], this virtual class is open to anyone in the world. Please visit: [email or web address] for more information.  Books We Love: The Doula Guide to Childbirth by Retia Cook and Vernell Donald The First-Time Parent's Childbirth Handbook by Dr. Corean Glatter, CNM The Birth Partner by [employer]  Options for Doula Care in the [place] Area  As you review your birthing options, consider having a birth doula. A doula is trained to provide support before, during and just after you give birth. There are also postpartum doulas that help you adjust to new parenthood.  While doulas do not provide medical care, they do provide emotional, physical and educational support. A few months before your baby arrives, doulas can help answer questions, ease concerns and help you create and support your birthing plan.    Doulas can help reduce your stress and comfort you and your partner. They can help you cope with labor by helping you use breathing techniques, massage, creative labor positioning,  essential oils and affirmations.   Studies show that the benefits of having a doula include:   A more positive birth experience  Fewer requests for pain-relief medication  Less likelihood of cesarean section, commonly called a c-section   Doulas are typically hired via a advertising account planner between you and the doula. We are happy to provide a list of the most active doulas in  the area, all of whom are credentialed by [facility] and will not count as a visitor at your birth.  There are several options for no-cost doula care at our hospital, including:  [facility] Volunteer Doula Program Every W.w[employer] Program A Cure 4 Moms Doula Study (available only at [employer] for Women, [place], [place] and [facility] offices)  For more information on these programs or to receive a list of doulas active in our area, please email [email or web address]

## 2023-07-11 LAB — AFP, SERUM, OPEN SPINA BIFIDA
AFP MoM: 1.12
AFP Value: 40.8 ng/mL
Gest. Age on Collection Date: 17.1 wk
Maternal Age At EDD: 22.5 a
OSBR Risk 1 IN: 10000
Test Results:: NEGATIVE
Weight: 189 [lb_av]

## 2023-07-23 ENCOUNTER — Ambulatory Visit: Payer: 59

## 2023-07-23 ENCOUNTER — Other Ambulatory Visit: Payer: Self-pay | Admitting: *Deleted

## 2023-07-23 ENCOUNTER — Ambulatory Visit: Payer: 59 | Attending: Obstetrics and Gynecology

## 2023-07-23 ENCOUNTER — Encounter: Payer: Self-pay | Admitting: *Deleted

## 2023-07-23 ENCOUNTER — Ambulatory Visit: Payer: 59 | Attending: Obstetrics and Gynecology | Admitting: Obstetrics

## 2023-07-23 VITALS — BP 113/54 | HR 53

## 2023-07-23 DIAGNOSIS — D563 Thalassemia minor: Secondary | ICD-10-CM

## 2023-07-23 DIAGNOSIS — O358XX0 Maternal care for other (suspected) fetal abnormality and damage, not applicable or unspecified: Secondary | ICD-10-CM | POA: Insufficient documentation

## 2023-07-23 DIAGNOSIS — Z3492 Encounter for supervision of normal pregnancy, unspecified, second trimester: Secondary | ICD-10-CM | POA: Insufficient documentation

## 2023-07-23 DIAGNOSIS — Z363 Encounter for antenatal screening for malformations: Secondary | ICD-10-CM | POA: Diagnosis not present

## 2023-07-23 DIAGNOSIS — Z3490 Encounter for supervision of normal pregnancy, unspecified, unspecified trimester: Secondary | ICD-10-CM | POA: Insufficient documentation

## 2023-07-23 DIAGNOSIS — O99012 Anemia complicating pregnancy, second trimester: Secondary | ICD-10-CM

## 2023-07-23 DIAGNOSIS — O280 Abnormal hematological finding on antenatal screening of mother: Secondary | ICD-10-CM

## 2023-07-23 DIAGNOSIS — Z3A19 19 weeks gestation of pregnancy: Secondary | ICD-10-CM

## 2023-07-23 DIAGNOSIS — Z3689 Encounter for other specified antenatal screening: Secondary | ICD-10-CM

## 2023-07-23 NOTE — Progress Notes (Signed)
 MFM Consult Note  Lauren Fernandez is currently at 19 weeks and 1 day.  She was seen for a detailed fetal anatomy scan.  She denies any significant past medical history and denies any problems in her current pregnancy.    She had a cell free DNA test earlier in her pregnancy which indicated a low risk for trisomy 19, 49, and 13. A female fetus is predicted.    Her Horizon test indicated that she is a silent carrier for alpha thalassemia.  She was informed that the fetal growth and amniotic fluid level were appropriate for her gestational age.   There were no obvious fetal anomalies noted on today's ultrasound exam.  However, today's exam was limited due to the fetal position.  The patient was informed that anomalies may be missed due to technical limitations. If the fetus is in a suboptimal position or maternal habitus is increased, visualization of the fetus in the maternal uterus may be impaired.  A follow-up exam was scheduled in 6 weeks to complete the views of the fetal anatomy and to assess the fetal growth.  The patient stated that all of her questions were answered today.  A total of 30 minutes was spent counseling and coordinating the care for this patient.  Greater than 50% of the time was spent in direct face-to-face contact.

## 2023-08-06 ENCOUNTER — Ambulatory Visit (INDEPENDENT_AMBULATORY_CARE_PROVIDER_SITE_OTHER): Payer: Commercial Managed Care - PPO | Admitting: Family Medicine

## 2023-08-06 ENCOUNTER — Other Ambulatory Visit: Payer: Self-pay

## 2023-08-06 VITALS — BP 106/71 | HR 78 | Wt 201.6 lb

## 2023-08-06 DIAGNOSIS — Z3492 Encounter for supervision of normal pregnancy, unspecified, second trimester: Secondary | ICD-10-CM | POA: Diagnosis not present

## 2023-08-06 DIAGNOSIS — D563 Thalassemia minor: Secondary | ICD-10-CM | POA: Diagnosis not present

## 2023-08-06 DIAGNOSIS — Z3A21 21 weeks gestation of pregnancy: Secondary | ICD-10-CM

## 2023-08-06 MED ORDER — FAMOTIDINE 40 MG PO TABS: 40.0000 mg | ORAL_TABLET | Freq: Every day | ORAL | 0 refills | Status: DC

## 2023-08-06 NOTE — Progress Notes (Signed)
   PRENATAL VISIT NOTE  Subjective:  Lauren Fernandez is a 23 y.o. G1P0 at [redacted]w[redacted]d being seen today for ongoing prenatal care.  She is currently monitored for the following issues for this low-risk pregnancy and has Supervision of low-risk pregnancy and Alpha thalassemia silent carrier on their problem list.  Patient reports no bleeding, no contractions, no cramping, and no leaking.  Contractions: Not present. Vag. Bleeding: None.  Movement: Present. Denies leaking of fluid.   The following portions of the patient's history were reviewed and updated as appropriate: allergies, current medications, past family history, past medical history, past social history, past surgical history and problem list.   Objective:   Vitals:   08/06/23 1005  BP: 106/71  Pulse: 78  Weight: 201 lb 9 oz (91.4 kg)    Fetal Status: Fetal Heart Rate (bpm): 143   Movement: Present     General:  Alert, oriented and cooperative. Patient is in no acute distress.  Skin: Skin is warm and dry. No rash noted.   Cardiovascular: Normal heart rate noted  Respiratory: Normal respiratory effort, no problems with respiration noted  Abdomen: Soft, gravid, appropriate for gestational age.  Pain/Pressure: Absent     Pelvic: Cervical exam deferred        Extremities: Normal range of motion.  Edema: None  Mental Status: Normal mood and affect. Normal behavior. Normal judgment and thought content.   Assessment and Plan:  Pregnancy: G1P0 at [redacted]w[redacted]d 1. Encounter for supervision of low-risk pregnancy in second trimester (Primary) FHR and BP appropriate today Patient having reflux so given Pepcid  2. Alpha thalassemia silent carrier Partner has testing kit.  Awaiting results  3. [redacted] weeks gestation of pregnancy Follow-up in 4 weeks  Preterm labor symptoms and general obstetric precautions including but not limited to vaginal bleeding, contractions, leaking of fluid and fetal movement were reviewed in detail with the  patient. Please refer to After Visit Summary for other counseling recommendations.   No follow-ups on file.  Future Appointments  Date Time Provider Department Center  09/03/2023  9:55 AM Venora Maples, MD [facility]  09/03/2023 11:30 AM [facility] US5 [facility]  09/29/2023  9:55 AM Nobie Putnam, Cyndi Lennert, MD [facility]    Celedonio Savage, MD

## 2023-09-03 ENCOUNTER — Ambulatory Visit: Attending: Obstetrics | Admitting: Obstetrics

## 2023-09-03 ENCOUNTER — Ambulatory Visit: Payer: 59 | Attending: Obstetrics

## 2023-09-03 ENCOUNTER — Encounter: Payer: Self-pay | Admitting: Family Medicine

## 2023-09-03 ENCOUNTER — Other Ambulatory Visit: Payer: Self-pay

## 2023-09-03 ENCOUNTER — Other Ambulatory Visit: Payer: Self-pay | Admitting: *Deleted

## 2023-09-03 ENCOUNTER — Ambulatory Visit (INDEPENDENT_AMBULATORY_CARE_PROVIDER_SITE_OTHER): Payer: 59 | Admitting: Family Medicine

## 2023-09-03 VITALS — BP 106/69 | HR 76 | Wt 214.3 lb

## 2023-09-03 DIAGNOSIS — D563 Thalassemia minor: Secondary | ICD-10-CM

## 2023-09-03 DIAGNOSIS — Z3492 Encounter for supervision of normal pregnancy, unspecified, second trimester: Secondary | ICD-10-CM

## 2023-09-03 DIAGNOSIS — Z3A25 25 weeks gestation of pregnancy: Secondary | ICD-10-CM

## 2023-09-03 DIAGNOSIS — O358XX0 Maternal care for other (suspected) fetal abnormality and damage, not applicable or unspecified: Secondary | ICD-10-CM | POA: Diagnosis not present

## 2023-09-03 DIAGNOSIS — O35EXX0 Maternal care for other (suspected) fetal abnormality and damage, fetal genitourinary anomalies, not applicable or unspecified: Secondary | ICD-10-CM

## 2023-09-03 DIAGNOSIS — O99012 Anemia complicating pregnancy, second trimester: Secondary | ICD-10-CM | POA: Diagnosis not present

## 2023-09-03 DIAGNOSIS — Z3689 Encounter for other specified antenatal screening: Secondary | ICD-10-CM | POA: Insufficient documentation

## 2023-09-03 NOTE — Progress Notes (Signed)
   Subjective:  Lauren Fernandez is a 23 y.o. G1P0 at [redacted]w[redacted]d being seen today for ongoing prenatal care.  She is currently monitored for the following issues for this low-risk pregnancy and has Supervision of low-risk pregnancy and Alpha thalassemia silent carrier on their problem list.  Patient reports no complaints.  Contractions: Not present. Vag. Bleeding: None.  Movement: Present. Denies leaking of fluid.   The following portions of the patient's history were reviewed and updated as appropriate: allergies, current medications, past family history, past medical history, past social history, past surgical history and problem list. Problem list updated.  Objective:   Vitals:   09/03/23 1004  BP: 106/69  Pulse: 76  Weight: 214 lb 4.8 oz (97.2 kg)    Fetal Status: Fetal Heart Rate (bpm): 145   Movement: Present     General:  Alert, oriented and cooperative. Patient is in no acute distress.  Skin: Skin is warm and dry. No rash noted.   Cardiovascular: Normal heart rate noted  Respiratory: Normal respiratory effort, no problems with respiration noted  Abdomen: Soft, gravid, appropriate for gestational age. Pain/Pressure: Absent     Pelvic: Vag. Bleeding: None     Cervical exam deferred        Extremities: Normal range of motion.  Edema: None  Mental Status: Normal mood and affect. Normal behavior. Normal judgment and thought content.   Urinalysis:      Assessment and Plan:  Pregnancy: G1P0 at [redacted]w[redacted]d  1. Encounter for supervision of low-risk pregnancy in second trimester (Primary) BP and FHR normal Discussed fasting labs next visit Anatomy scan scheduled for later this morning  2. Alpha thalassemia silent carrier Previously given partner kit, but did not get a result, given new kit  Preterm labor symptoms and general obstetric precautions including but not limited to vaginal bleeding, contractions, leaking of fluid and fetal movement were reviewed in detail with the  patient. Please refer to After Visit Summary for other counseling recommendations.  Return in about 4 weeks (around 10/01/2023) for [place] patient, ob visit.   Venora Maples, MD

## 2023-09-03 NOTE — Progress Notes (Signed)
 MFM Consult Note  Lauren Fernandez is currently at 25 weeks and 1 day.  She was seen to complete the views of the fetal anatomy.  She denies any problems since her last exam.  She was informed that the fetal growth and amniotic fluid level appears appropriate for her gestational age.  Left pyelectasis measuring 0.8 cm dilated was noted on today's ultrasound exam.    She was advised that the pyelectasis noted on prenatal ultrasounds will often resolve spontaneously after birth.  However, there may also be other processes such as an obstruction or reflux causing this finding that may require treatment after birth.    She was advised that we will continue to follow her closely to assess this finding.    A follow-up exam was scheduled in 5 weeks to assess the fetal kidneys.    The patient stated that all of her questions were answered today.  A total of 20 minutes was spent counseling and coordinating the care for this patient.  Greater than 50% of the time was spent in direct face-to-face contact.

## 2023-09-03 NOTE — Patient Instructions (Signed)

## 2023-09-17 ENCOUNTER — Inpatient Hospital Stay (HOSPITAL_COMMUNITY)
Admission: AD | Admit: 2023-09-17 | Discharge: 2023-09-17 | Disposition: A | Discharge status: Home / Self Care | Attending: Obstetrics and Gynecology | Admitting: Obstetrics and Gynecology

## 2023-09-17 ENCOUNTER — Encounter (HOSPITAL_COMMUNITY): Payer: Self-pay | Admitting: Obstetrics and Gynecology

## 2023-09-17 DIAGNOSIS — Z3A27 27 weeks gestation of pregnancy: Secondary | ICD-10-CM | POA: Insufficient documentation

## 2023-09-17 DIAGNOSIS — K226 Gastro-esophageal laceration-hemorrhage syndrome: Secondary | ICD-10-CM | POA: Insufficient documentation

## 2023-09-17 DIAGNOSIS — K2100 Gastro-esophageal reflux disease with esophagitis, without bleeding: Secondary | ICD-10-CM

## 2023-09-17 DIAGNOSIS — O99612 Diseases of the digestive system complicating pregnancy, second trimester: Secondary | ICD-10-CM | POA: Insufficient documentation

## 2023-09-17 DIAGNOSIS — K219 Gastro-esophageal reflux disease without esophagitis: Secondary | ICD-10-CM | POA: Insufficient documentation

## 2023-09-17 HISTORY — DX: Gastro-esophageal reflux disease without esophagitis: K21.9

## 2023-09-17 LAB — URINALYSIS, ROUTINE W REFLEX MICROSCOPIC
Bilirubin Urine: NEGATIVE
Glucose, UA: NEGATIVE mg/dL
Hgb urine dipstick: NEGATIVE
Ketones, ur: NEGATIVE mg/dL
Leukocytes,Ua: NEGATIVE
Nitrite: NEGATIVE
Protein, ur: NEGATIVE mg/dL
Specific Gravity, Urine: 1.01 (ref 1.005–1.030)
pH: 6 (ref 5.0–8.0)

## 2023-09-17 MED ORDER — OMEPRAZOLE MAGNESIUM 20 MG PO TBEC: DELAYED_RELEASE_TABLET | ORAL | 3 refills | Status: AC

## 2023-09-17 MED ORDER — SUCRALFATE 1 G PO TABS: ORAL_TABLET | ORAL | 3 refills | Status: AC

## 2023-09-17 NOTE — Discharge Instructions (Signed)
 Safe Medications in Pregnancy   Acne: Benzoyl Peroxide Salicylic Acid  Backache/Headache: Tylenol: 2 regular strength every 4 hours OR              2 Extra strength every 6 hours  Colds/Coughs/Allergies: Benadryl (alcohol free) 25 mg every 6 hou

## 2023-09-17 NOTE — MAU Provider Note (Signed)
  History     CSN: [id number]  Arrival date and time: 09/17/23 0947   None     Chief Complaint  Patient presents with   Gastroesophageal Reflux   Emesis   HPI  No pmh no hx bleeding here with 2 months acid reflux symptoms, this morning vomited and there was a small amount of brb in her vomit. One episode, no pain, no melena or hematochezia, hasn't happened before, hasn't been taking aspirin , hasn't been taking anything for gerd.   Past Medical History:  Diagnosis Date   Asthma    as a child   Chlamydia 2018   GERD (gastroesophageal reflux disease)    Gonorrhea 2018   Medical history non-contributory     Past Surgical History:  Procedure Laterality Date   NO PAST SURGERIES      Family History  Problem Relation Age of Onset   Healthy Mother    Healthy Father    Stroke Maternal Uncle     Social History   Tobacco Use   Smoking status: Never   Smokeless tobacco: Never  Vaping Use   Vaping status: Former   Substances: THC  Substance Use Topics   Alcohol use: Not Currently    Comment: occasional   Drug use: Never    Allergies: No Known Allergies  Medications Prior to Admission  Medication Sig Dispense Refill Last Dose/Taking   acetaminophen  (TYLENOL ) 650 MG CR tablet Take 650 mg by mouth every 8 (eight) hours as needed for pain. (Patient not taking: Reported on 06/08/2023)   Not Taking   aspirin  EC 81 MG tablet Take 1 tablet (81 mg total) by mouth daily. Start taking when you are [redacted] weeks pregnant for rest of pregnancy for prevention of preeclampsia (Patient not taking: Reported on 07/09/2023) 300 tablet 2    famotidine  (PEPCID ) 40 MG tablet Take 1 tablet (40 mg total) by mouth daily. (Patient not taking: Reported on 09/03/2023) 30 tablet 0    ondansetron  (ZOFRAN  ODT) 4 MG disintegrating tablet Take 1 tablet (4 mg total) by mouth every 8 (eight) hours as needed for nausea or vomiting. (Patient not taking: Reported on 05/31/2023) 20 tablet 0    Prenatal Vit-Fe  Phos-FA-Omega (VITAFOL  GUMMIES) 3.33-0.333-34.8 MG CHEW Chew 1 tablet by mouth daily. 90 tablet 5 09/13/2023    Review of Systems Physical Exam   Blood pressure 126/71, pulse 99, temperature 98.4 F (36.9 C), temperature source Oral, resp. rate 18, height 5' 7 (1.702 m), weight 99.3 kg, last menstrual period 03/11/2023, SpO2 100%.  Physical Exam NAD RRR Abd gravid, non-tender  Assessment and Plan   Here with GERD and probable mallory weiss tear. No signs other bleeding, no pain to suggest acute abdomen. Suspect 2 months of gerd set her up for this mw tear. Given daily symptoms will start ppi and prn sucralfate . Strict gi bleeding/hematemesis return precautions discussed. Hasn't been taking aspirin , given current gestational age and now this bleeding think prudent to continue to hold that. F/u ob provider 4/30 as scheduled.   Devaughn NOVAK Audreanna Torrisi 09/17/2023, 10:48 AM

## 2023-09-17 NOTE — MAU Note (Signed)
.  Lauren Fernandez is a 23 y.o. at [redacted]w[redacted]d here in MAU reporting: on-going acid reflux and reports one episode of emesis where she reports throwing up blood. Reports burning in her throat 8/10. Denies nausea, VB, or LOF. Reports no FM felt this AM.   Reports no OTC medications tried. Did have prescription but has not picked up yet.  LMP: Patient's last menstrual period was 03/11/2023. Onset of complaint: on-going  FHT: Fetal Heart Rate Mode: Doppler Baseline Rate (A): 140 bpm Multiple birth?: No Lab orders placed from triage: UA

## 2023-09-29 ENCOUNTER — Other Ambulatory Visit: Payer: Self-pay

## 2023-09-29 ENCOUNTER — Ambulatory Visit (INDEPENDENT_AMBULATORY_CARE_PROVIDER_SITE_OTHER): Payer: 59 | Admitting: Family Medicine

## 2023-09-29 VITALS — BP 116/78 | HR 72 | Wt 221.6 lb

## 2023-09-29 DIAGNOSIS — Z3A28 28 weeks gestation of pregnancy: Secondary | ICD-10-CM

## 2023-09-29 DIAGNOSIS — Z3490 Encounter for supervision of normal pregnancy, unspecified, unspecified trimester: Secondary | ICD-10-CM

## 2023-09-29 DIAGNOSIS — D563 Thalassemia minor: Secondary | ICD-10-CM

## 2023-09-29 DIAGNOSIS — Z3492 Encounter for supervision of normal pregnancy, unspecified, second trimester: Secondary | ICD-10-CM

## 2023-09-29 NOTE — Progress Notes (Signed)
   PRENATAL VISIT NOTE  Subjective:  Lauren Fernandez is a 23 y.o. G1P0 at [redacted]w[redacted]d being seen today for ongoing prenatal care.  She is currently monitored for the following issues for this low-risk pregnancy and has Supervision of low-risk pregnancy; Alpha thalassemia silent carrier; GERD (gastroesophageal reflux disease); and Mallory-Weiss tear on their problem list.  Patient reports no bleeding, no contractions, no cramping, and no leaking.  Contractions: Not present. Vag. Bleeding: None.  Movement: Present. Denies leaking of fluid.   The following portions of the patient's history were reviewed and updated as appropriate: allergies, current medications, past family history, past medical history, past social history, past surgical history and problem list.   Objective:   Vitals:   09/29/23 1003  BP: 116/78  Pulse: 72  Weight: 221 lb 9.6 oz (100.5 kg)    Fetal Status: Fetal Heart Rate (bpm): 138   Movement: Present     General:  Alert, oriented and cooperative. Patient is in no acute distress.  Skin: Skin is warm and dry. No rash noted.   Cardiovascular: Normal heart rate noted  Respiratory: Normal respiratory effort, no problems with respiration noted  Abdomen: Soft, gravid, appropriate for gestational age.  Pain/Pressure: Absent     Pelvic: Cervical exam deferred        Extremities: Normal range of motion.  Edema: None  Mental Status: Normal mood and affect. Normal behavior. Normal judgment and thought content.   Assessment and Plan:  Pregnancy: G1P0 at [redacted]w[redacted]d 1. Encounter for supervision of low-risk pregnancy in second trimester (Primary) FHR and BP appropriate today  2. Alpha thalassemia silent carrier Patient previously received testing kit  3. [redacted] weeks gestation of pregnancy Thought she was getting 28-week labs today but apparently at lab visit not scheduled.  Is open to come back tomorrow for 28-week labs with GTT.  Will be scheduled at the front desk on the way  out.  Preterm labor symptoms and general obstetric precautions including but not limited to vaginal bleeding, contractions, leaking of fluid and fetal movement were reviewed in detail with the patient. Please refer to After Visit Summary for other counseling recommendations.   No follow-ups on file.  Future Appointments  Date Time Provider Department Center  10/15/2023  9:55 AM Teena Feast, MD [facility]  10/29/2023  8:55 AM Ferdie Housekeeper, MD [facility]  10/29/2023  2:00 PM [facility] PROVIDER 1 [facility]  10/29/2023  2:30 PM [facility] US1 [facility]    Ferdie Housekeeper, MD

## 2023-09-30 ENCOUNTER — Encounter: Payer: Self-pay | Admitting: Family Medicine

## 2023-10-01 ENCOUNTER — Other Ambulatory Visit

## 2023-10-01 ENCOUNTER — Other Ambulatory Visit: Payer: Self-pay

## 2023-10-01 DIAGNOSIS — Z3490 Encounter for supervision of normal pregnancy, unspecified, unspecified trimester: Secondary | ICD-10-CM

## 2023-10-02 LAB — CBC
Hematocrit: 30.8 % — ABNORMAL LOW (ref 34.0–46.6)
Hemoglobin: 10 g/dL — ABNORMAL LOW (ref 11.1–15.9)
MCH: 28.2 pg (ref 26.6–33.0)
MCHC: 32.5 g/dL (ref 31.5–35.7)
MCV: 87 fL (ref 79–97)
Platelets: 246 10*3/uL (ref 150–450)
RBC: 3.55 x10E6/uL — ABNORMAL LOW (ref 3.77–5.28)
RDW: 12.2 % (ref 11.7–15.4)
WBC: 6.5 10*3/uL (ref 3.4–10.8)

## 2023-10-02 LAB — GLUCOSE TOLERANCE, 2 HOURS W/ 1HR
Glucose, 1 hour: 113 mg/dL (ref 70–179)
Glucose, 2 hour: 138 mg/dL (ref 70–152)
Glucose, Fasting: 77 mg/dL (ref 70–91)

## 2023-10-02 LAB — RPR: RPR Ser Ql: NONREACTIVE

## 2023-10-02 LAB — HIV ANTIBODY (ROUTINE TESTING W REFLEX): HIV Screen 4th Generation wRfx: NONREACTIVE

## 2023-10-08 ENCOUNTER — Ambulatory Visit

## 2023-10-15 ENCOUNTER — Ambulatory Visit: Admitting: Family Medicine

## 2023-10-15 ENCOUNTER — Other Ambulatory Visit: Payer: Self-pay

## 2023-10-15 VITALS — BP 111/75 | HR 80 | Wt 224.8 lb

## 2023-10-15 DIAGNOSIS — Z3A31 31 weeks gestation of pregnancy: Secondary | ICD-10-CM

## 2023-10-15 DIAGNOSIS — Z3493 Encounter for supervision of normal pregnancy, unspecified, third trimester: Secondary | ICD-10-CM

## 2023-10-15 DIAGNOSIS — O283 Abnormal ultrasonic finding on antenatal screening of mother: Secondary | ICD-10-CM | POA: Insufficient documentation

## 2023-10-15 DIAGNOSIS — D563 Thalassemia minor: Secondary | ICD-10-CM

## 2023-10-15 DIAGNOSIS — O99013 Anemia complicating pregnancy, third trimester: Secondary | ICD-10-CM | POA: Insufficient documentation

## 2023-10-15 MED ORDER — FERROUS SULFATE 325 (65 FE) MG PO TBEC: 325.0000 mg | DELAYED_RELEASE_TABLET | ORAL | 2 refills | Status: AC

## 2023-10-15 NOTE — Progress Notes (Signed)
   Subjective:  Lauren Fernandez is a 23 y.o. G1P0 at [redacted]w[redacted]d being seen today for ongoing prenatal care.  She is currently monitored for the following issues for this low-risk pregnancy and has Supervision of low-risk pregnancy; Alpha thalassemia silent carrier; GERD (gastroesophageal reflux disease); Mallory-Weiss tear; Anemia affecting pregnancy in third trimester; and Abnormal fetal ultrasound on their problem list.  Patient reports no complaints.  Contractions: Not present. Vag. Bleeding: None.  Movement: Present. Denies leaking of fluid.   The following portions of the patient's history were reviewed and updated as appropriate: allergies, current medications, past family history, past medical history, past social history, past surgical history and problem list. Problem list updated.  Objective:   Vitals:   10/15/23 1029  BP: 111/75  Pulse: 80  Weight: 224 lb 12.8 oz (102 kg)    Fetal Status: Fetal Heart Rate (bpm): 136   Movement: Present     General:  Alert, oriented and cooperative. Patient is in no acute distress.  Skin: Skin is warm and dry. No rash noted.   Cardiovascular: Normal heart rate noted  Respiratory: Normal respiratory effort, no problems with respiration noted  Abdomen: Soft, gravid, appropriate for gestational age. Pain/Pressure: Present     Pelvic: Vag. Bleeding: None     Cervical exam deferred        Extremities: Normal range of motion.  Edema: Trace  Mental Status: Normal mood and affect. Normal behavior. Normal judgment and thought content.   Urinalysis:      Assessment and Plan:  Pregnancy: G1P0 at [redacted]w[redacted]d  1. Encounter for supervision of low-risk pregnancy in third trimester (Primary) BP and FHR normal Up to date on labs  2. Anemia affecting pregnancy in third trimester Lab Results  Component Value Date   HGB 10.0 (L) 10/01/2023   Mild anemia on third trimester labs, discussed PO iron, she is amenable Recheck CBC in 4 weeks  3. Abnormal fetal  ultrasound Mild pyelectasis, f/w MFM  4. Alpha thalassemia silent carrier Given partner kit previously, second kit still sitting at home, they will send it in soon  Preterm labor symptoms and general obstetric precautions including but not limited to vaginal bleeding, contractions, leaking of fluid and fetal movement were reviewed in detail with the patient. Please refer to After Visit Summary for other counseling recommendations.  Return in 2 weeks (on 10/29/2023) for [facility] patient, ob visit.   Teena Feast, MD

## 2023-10-15 NOTE — Patient Instructions (Signed)

## 2023-10-25 ENCOUNTER — Telehealth: Admitting: Physician Assistant

## 2023-10-25 DIAGNOSIS — G44209 Tension-type headache, unspecified, not intractable: Secondary | ICD-10-CM

## 2023-10-25 DIAGNOSIS — O163 Unspecified maternal hypertension, third trimester: Secondary | ICD-10-CM | POA: Diagnosis not present

## 2023-10-25 DIAGNOSIS — Z3A28 28 weeks gestation of pregnancy: Secondary | ICD-10-CM

## 2023-10-25 MED ORDER — CYCLOBENZAPRINE HCL 5 MG PO TABS: 5.0000 mg | ORAL_TABLET | Freq: Three times a day (TID) | ORAL | 0 refills | Status: DC | PRN

## 2023-10-25 NOTE — Progress Notes (Signed)
 Virtual Visit Consent   Lauren Fernandez, you are scheduled for a virtual visit with a [facility] provider today. Just as with appointments in the office, your consent must be obtained to participate. Your consent will be active for this visit and any virtual visit you may have with one of our providers in the next 365 days. If you have a MyChart account, a copy of this consent can be sent to you electronically.  As this is a virtual visit, video technology does not allow for your provider to perform a traditional examination. This may limit your provider's ability to fully assess your condition. If your provider identifies any concerns that need to be evaluated in person or the need to arrange testing (Fernandez as labs, EKG, etc.), we will make arrangements to do so. Although advances in technology are sophisticated, we cannot ensure that it will always work on either your end or our end. If the connection with a video visit is poor, the visit may have to be switched to a telephone visit. With either a video or telephone visit, we are not always able to ensure that we have a secure connection.  By engaging in this virtual visit, you consent to the provision of healthcare and authorize for your insurance to be billed (if applicable) for the services provided during this visit. Depending on your insurance coverage, you may receive a charge related to this service.  I need to obtain your verbal consent now. Are you willing to proceed with your visit today? Lauren Fernandez has provided verbal consent on 10/25/2023 for a virtual visit (video or telephone). Angelia Kelp, PA-C  Date: 10/25/2023 7:07 PM   Virtual Visit via Video Note   IAngelia Kelp, connected with  Lauren Fernandez  ([id number], 14-Oct-2000) on 10/25/23 at  6:00 PM EDT by a video-enabled telemedicine application and verified that I am speaking with the correct person using two identifiers.  Location: Patient: Virtual Visit  Location Patient: Home Provider: Virtual Visit Location Provider: Home Office   I discussed the limitations of evaluation and management by telemedicine and the availability of in person appointments. The patient expressed understanding and agreed to proceed.    History of Present Illness: Lauren Fernandez is a 23 y.o. who identifies as a female who was assigned female at birth, and is being seen today for headache and elevated BP.  HPI: Headache  This is a new problem. The current episode started in the past 7 days (2 days). The problem occurs constantly. The problem has been unchanged. The pain is located in the Temporal region. Radiates to: frontal. The pain quality is not similar to prior headaches. The quality of the pain is described as squeezing and pulsating. The pain is at a severity of 9/10 (at its worst). The pain is severe. Associated symptoms include dizziness (with standing), phonophobia and photophobia. Pertinent negatives include no back pain, blurred vision, coughing, ear pain, eye pain, eye redness, eye watering, fever, hearing loss, loss of balance, muscle aches, nausea, neck pain, numbness, scalp tenderness, sinus pressure, sore throat, tingling, tinnitus, visual change, vomiting or weakness. The symptoms are aggravated by bright light. She has tried darkened room and acetaminophen  for the symptoms. The treatment provided no relief.  BP: 133/90 today Just saw OB/GYN on 10/15/23 and BP was 111/75, was 116/78 on 09/29/23   Problems:  Patient Active Problem List   Diagnosis Date Noted   Anemia affecting pregnancy in third trimester 10/15/2023   Abnormal fetal  ultrasound 10/15/2023   GERD (gastroesophageal reflux disease) 09/17/2023   Mallory-Weiss tear 09/17/2023   Alpha thalassemia silent carrier 07/09/2023   Supervision of low-risk pregnancy 06/03/2023    Allergies: No Known Allergies Medications:  Current Outpatient Medications:    cyclobenzaprine (FLEXERIL) 5 MG  tablet, Take 1-2 tablets (5-10 mg total) by mouth 3 (three) times daily as needed for muscle spasms., Disp: 30 tablet, Rfl: 0   acetaminophen  (TYLENOL ) 650 MG CR tablet, Take 650 mg by mouth every 8 (eight) hours as needed for pain. (Patient not taking: Reported on 09/29/2023), Disp: , Rfl:    ferrous sulfate  325 (65 FE) MG EC tablet, Take 1 tablet (325 mg total) by mouth every other day., Disp: 30 tablet, Rfl: 2   omeprazole  (PRILOSEC  OTC) 20 MG tablet, Take once daily 30-60 minutes before breakfast, Disp: 60 tablet, Rfl: 3   ondansetron  (ZOFRAN  ODT) 4 MG disintegrating tablet, Take 1 tablet (4 mg total) by mouth every 8 (eight) hours as needed for nausea or vomiting. (Patient not taking: Reported on 05/31/2023), Disp: 20 tablet, Rfl: 0   Prenatal Vit-Fe Phos-FA-Omega (VITAFOL  GUMMIES) 3.33-0.333-34.8 MG CHEW, Chew 1 tablet by mouth daily., Disp: 90 tablet, Rfl: 5   sucralfate  (CARAFATE ) 1 g tablet, 3 times daily with meals as needed (Patient not taking: Reported on 10/15/2023), Disp: 90 tablet, Rfl: 3  Observations/Objective: Patient is well-developed, well-nourished in no acute distress.  Resting comfortably at home.  Head is normocephalic, atraumatic.  No labored breathing.  Speech is clear and coherent with logical content.  Patient is alert and oriented at baseline.    Assessment and Plan: 1. Tension headache (Primary) - cyclobenzaprine (FLEXERIL) 5 MG tablet; Take 1-2 tablets (5-10 mg total) by mouth 3 (three) times daily as needed for muscle spasms.  Dispense: 30 tablet; Refill: 0  2. Elevated blood pressure affecting pregnancy in third trimester, antepartum  - Suspect headache and pain causing elevation of BP - No warning signs requiring immediate ER evaluation - Will give low dose Flexeril for what is described as a tension headache - Can add Tylenol  with Flexeril - Cold compresses  - Discussed using Peppermint oil topically on temples for headache - Monitor BP at home - If  symptoms of headache worsen, if she develops nausea with vomiting, visual changes, severe dizziness, or numbness/tingling/weakness she should seek immediate care at the [facility] - Follow up with OB/GYN tomorrow  Follow Up Instructions: I discussed the assessment and treatment plan with the patient. The patient was provided an opportunity to ask questions and all were answered. The patient agreed with the plan and demonstrated an understanding of the instructions.  A copy of instructions were sent to the patient via MyChart unless otherwise noted below.    The patient was advised to call back or seek an in-person evaluation if the symptoms worsen or if the condition fails to improve as anticipated.    Angelia Kelp, PA-C

## 2023-10-25 NOTE — Patient Instructions (Signed)
 Lauren Fernandez, thank you for joining Angelia Kelp, PA-C for today's virtual visit.  While this provider is not your primary care provider (PCP), if your PCP is located in our provider database this encounter information will be shared with them immediately following your visit.   A [facility] MyChart account gives you access to today's visit and all your visits, tests, and labs performed at [facility] " click here if you don't have a [facility] MyChart account or go to [email or web address]  Consent: (Patient) Lauren Fernandez provided verbal consent for this virtual visit at the beginning of the encounter.  Current Medications:  Current Outpatient Medications:    cyclobenzaprine (FLEXERIL) 5 MG tablet, Take 1-2 tablets (5-10 mg total) by mouth 3 (three) times daily as needed for muscle spasms., Disp: 30 tablet, Rfl: 0   acetaminophen  (TYLENOL ) 650 MG CR tablet, Take 650 mg by mouth every 8 (eight) hours as needed for pain. (Patient not taking: Reported on 09/29/2023), Disp: , Rfl:    ferrous sulfate  325 (65 FE) MG EC tablet, Take 1 tablet (325 mg total) by mouth every other day., Disp: 30 tablet, Rfl: 2   omeprazole  (PRILOSEC  OTC) 20 MG tablet, Take once daily 30-60 minutes before breakfast, Disp: 60 tablet, Rfl: 3   ondansetron  (ZOFRAN  ODT) 4 MG disintegrating tablet, Take 1 tablet (4 mg total) by mouth every 8 (eight) hours as needed for nausea or vomiting. (Patient not taking: Reported on 05/31/2023), Disp: 20 tablet, Rfl: 0   Prenatal Vit-Fe Phos-FA-Omega (VITAFOL  GUMMIES) 3.33-0.333-34.8 MG CHEW, Chew 1 tablet by mouth daily., Disp: 90 tablet, Rfl: 5   sucralfate  (CARAFATE ) 1 g tablet, 3 times daily with meals as needed (Patient not taking: Reported on 10/15/2023), Disp: 90 tablet, Rfl: 3   Medications ordered in this encounter:  Meds ordered this encounter  Medications   cyclobenzaprine (FLEXERIL) 5 MG tablet    Sig: Take 1-2 tablets (5-10 mg total) by  mouth 3 (three) times daily as needed for muscle spasms.    Dispense:  30 tablet    Refill:  0    Supervising Provider:   LAMPTEY, PHILIP O [[id number]]     *If you need refills on other medications prior to your next appointment, please contact your pharmacy*  Follow-Up: Call back or seek an in-person evaluation if the symptoms worsen or if the condition fails to improve as anticipated.  [facility] [phone]  Other Instructions Tension Headache, Adult A tension headache is a feeling of pain, pressure, or aching over the front and sides of the head. The pain can be dull, or it can feel tight. There are two types of tension headache: Episodic tension headache. This is when the headaches happen fewer than 15 days a month. Chronic tension headache. This is when the headaches happen more than 15 days a month during a 95-month period. A tension headache can last from 30 minutes to several days. It is the most common kind of headache. Tension headaches are not normally associated with nausea or vomiting, and they do not get worse with physical activity. What are the causes? The exact cause of this condition is not known. Tension headaches are often triggered by stress, anxiety, or depression. Other triggers may include: Alcohol. Too much caffeine or caffeine withdrawal. Respiratory infections, such as colds, flu, or sinus infections. Dental problems or teeth clenching. Fatigue. Holding your head and neck in the same position for a long period of time, such as while using  a computer. Smoking. Arthritis of the neck. What are the signs or symptoms? Symptoms of this condition include: A feeling of pressure or tightness around the head. Dull, aching head pain. Pain over the front and sides of the head. Tenderness in the muscles of the head, neck, and shoulders. How is this diagnosed? This condition may be diagnosed based on your symptoms, your medical history, and a physical  exam. If your symptoms are severe or unusual, you may have imaging tests, such as a CT scan or an MRI of your head. Your vision may also be checked. How is this treated? This condition may be treated with lifestyle changes and with medicines that help relieve symptoms. Follow these instructions at home: Managing pain Take over-the-counter and prescription medicines only as told by your health care provider. When you have a headache, lie down in a dark, quiet room. If directed, put ice on your head and neck. To do this: Put ice in a plastic bag. Place a towel between your skin and the bag. Leave the ice on for 20 minutes, 2-3 times a day. Remove the ice if your skin turns bright red. This is very important. If you cannot feel pain, heat, or cold, you have a greater risk of damage to the area. If directed, apply heat to the back of your neck as often as told by your health care provider. Use the heat source that your health care provider recommends, such as a moist heat pack or a heating pad. Place a towel between your skin and the heat source. Leave the heat on for 20-30 minutes. Remove the heat if your skin turns bright red. This is especially important if you are unable to feel pain, heat, or cold. You have a greater risk of getting burned. Eating and drinking Eat meals on a regular schedule. If you drink alcohol: Limit how much you have to: 0-1 drink a day for women who are not pregnant. 0-2 drinks a day for men. Know how much alcohol is in your drink. In the [place] one drink equals one 12 oz bottle of beer (355 mL), one 5 oz glass of wine (148 mL), or one 1 oz glass of hard liquor (44 mL). Drink enough fluid to keep your urine pale yellow. Decrease your caffeine intake, or stop using caffeine. Lifestyle Get 7-9 hours of sleep each night, or get the amount of sleep recommended by your health care provider. At bedtime, remove computers, phones, and tablets from your room. Find ways to  manage your stress. This may include: Exercise. Deep breathing exercises. Yoga. Listening to music. Positive mental imagery. Try to sit up straight and avoid tensing your muscles. Do not use any products that contain nicotine or tobacco. These include cigarettes, chewing tobacco, and vaping devices, such as e-cigarettes. If you need help quitting, ask your health care provider. General instructions  Avoid any headache triggers. Keep a journal to help find out what may trigger your headaches. For example, write down: What you eat and drink. How much sleep you get. Any change to your diet or medicines. Keep all follow-up visits. This is important. Contact a health care provider if: Your headache does not get better. Your headache comes back. You are sensitive to sounds, light, or smells because of a headache. You have nausea or you vomit. Your stomach hurts. Get help right away if: You suddenly develop a severe headache, along with any of the following: A stiff neck. Nausea and vomiting. Confusion. Weakness  in one part or one side of your body. Double vision or loss of vision. Shortness of breath. Rash. Unusual sleepiness. Fever or chills. Trouble speaking. Pain in your eye or ear. Trouble walking or balancing. Feeling faint or passing out. Summary A tension headache is a feeling of pain, pressure, or aching over the front and sides of the head. A tension headache can last from 30 minutes to several days. It is the most common kind of headache. This condition may be diagnosed based on your symptoms, your medical history, and a physical exam. This condition may be treated with lifestyle changes and with medicines that help relieve symptoms. This information is not intended to replace advice given to you by your health care provider. Make sure you discuss any questions you have with your health care provider. Document Revised: 02/12/2023 Document Reviewed: 02/12/2023 Elsevier  Patient Education  2024 [employer]   If you have been instructed to have an in-person evaluation today at a local Urgent Care facility, please use the link below. It will take you to a list of all of our available [facility], including address, phone number and hours of operation. Please do not delay care.  [facility]  If you or a family member do not have a primary care provider, use the link below to schedule a visit and establish care. When you choose a [facility] primary care physician or advanced practice provider, you gain a long-term partner in health. Find a Primary Care Provider  Learn more about [facility]'s in-office and virtual care options: [facility] - Get Care Now

## 2023-10-28 ENCOUNTER — Telehealth: Payer: Self-pay | Admitting: Lactation Services

## 2023-10-28 NOTE — Telephone Encounter (Signed)
-----   Message from Teena Feast sent at 10/26/2023  8:53 AM EDT ----- Please reach out to patient and see how she's doing and if headache has resolved? Also check in about BP. If still having headache or BP still elevated please have her come in for BP check. ----- Message ----- From: Angelia Kelp, PA-C Sent: 10/25/2023   7:08 PM EDT To: Teena Feast, MD

## 2023-10-28 NOTE — Telephone Encounter (Signed)
 Called and spoke with patient. She reports her headache went away after the 2 days. She reports the headache was in the frontal area and on the sides of her skull. She reports she has never had a headache this bad.   She reports her BP was checked on the same day of the headache and was 133/90  at the Pharmacy. She took Tylenol  later that day at the Pharmacy was 114/75  She reports no other signs or symptoms since. She reports she has been trying to take it easier.   She has an ultrasound and provider appointment tomorrow.   She is aware if her headache returns and her BP is elevated to go to the [facility] for evaluation.   She thanked us  for checking in on her.

## 2023-10-29 ENCOUNTER — Ambulatory Visit: Admitting: Family Medicine

## 2023-10-29 ENCOUNTER — Ambulatory Visit: Payer: PRIVATE HEALTH INSURANCE | Attending: Obstetrics and Gynecology | Admitting: Obstetrics

## 2023-10-29 ENCOUNTER — Ambulatory Visit: Payer: PRIVATE HEALTH INSURANCE

## 2023-10-29 ENCOUNTER — Other Ambulatory Visit: Payer: Self-pay

## 2023-10-29 VITALS — BP 97/66 | HR 72 | Wt 228.0 lb

## 2023-10-29 DIAGNOSIS — O35EXX0 Maternal care for other (suspected) fetal abnormality and damage, fetal genitourinary anomalies, not applicable or unspecified: Secondary | ICD-10-CM | POA: Diagnosis present

## 2023-10-29 DIAGNOSIS — Z3493 Encounter for supervision of normal pregnancy, unspecified, third trimester: Secondary | ICD-10-CM

## 2023-10-29 DIAGNOSIS — Z3A33 33 weeks gestation of pregnancy: Secondary | ICD-10-CM

## 2023-10-29 DIAGNOSIS — Z363 Encounter for antenatal screening for malformations: Secondary | ICD-10-CM | POA: Diagnosis not present

## 2023-10-29 DIAGNOSIS — Z148 Genetic carrier of other disease: Secondary | ICD-10-CM | POA: Diagnosis not present

## 2023-10-29 DIAGNOSIS — D563 Thalassemia minor: Secondary | ICD-10-CM

## 2023-10-29 DIAGNOSIS — O99013 Anemia complicating pregnancy, third trimester: Secondary | ICD-10-CM | POA: Diagnosis not present

## 2023-10-29 DIAGNOSIS — G44209 Tension-type headache, unspecified, not intractable: Secondary | ICD-10-CM

## 2023-10-29 DIAGNOSIS — O163 Unspecified maternal hypertension, third trimester: Secondary | ICD-10-CM

## 2023-10-29 DIAGNOSIS — O283 Abnormal ultrasonic finding on antenatal screening of mother: Secondary | ICD-10-CM

## 2023-10-29 NOTE — Progress Notes (Signed)
   PRENATAL VISIT NOTE  Subjective:  Lauren Fernandez is a 23 y.o. G1P0 at [redacted]w[redacted]d being seen today for ongoing prenatal care.  She is currently monitored for the following issues for this low-risk pregnancy and has Supervision of low-risk pregnancy; Alpha thalassemia silent carrier; GERD (gastroesophageal reflux disease); Mallory-Weiss tear; Anemia affecting pregnancy in third trimester; and Abnormal fetal ultrasound on their problem list.  Patient reports no bleeding, no contractions, no cramping, and no leaking.  Contractions: Not present. Vag. Bleeding: None.  Movement: Present. Denies leaking of fluid.   The following portions of the patient's history were reviewed and updated as appropriate: allergies, current medications, past family history, past medical history, past social history, past surgical history and problem list.   Objective:    Vitals:   10/29/23 0916  BP: 97/66  Pulse: 72  Weight: 228 lb (103.4 kg)    Fetal Status:  Fetal Heart Rate (bpm): 134   Movement: Present    General: Alert, oriented and cooperative. Patient is in no acute distress.  Skin: Skin is warm and dry. No rash noted.   Cardiovascular: Normal heart rate noted  Respiratory: Normal respiratory effort, no problems with respiration noted  Abdomen: Soft, gravid, appropriate for gestational age.  Pain/Pressure: Absent     Pelvic: Cervical exam deferred        Extremities: Normal range of motion.  Edema: None  Mental Status: Normal mood and affect. Normal behavior. Normal judgment and thought content.   Assessment and Plan:  Pregnancy: G1P0 at [redacted]w[redacted]d 1. Encounter for supervision of low-risk pregnancy in third trimester (Primary) FHR and BP appropriate today Continue routine prenatal care  2. Elevated blood pressure affecting pregnancy in third trimester, antepartum BP within normal limits  3. Tension headache Reports had a terrible headache that resolved with Tylenol .  Discussed Excedrin tension  headache.  4. Anemia affecting pregnancy in third trimester On iron Plan for repeat CBC in 2 weeks  5. Abnormal fetal ultrasound Mild pyelectasis, ultrasound today  6. Alpha thalassemia silent carrier Partner has sent testing At  7. [redacted] weeks gestation of pregnancy  Preterm labor symptoms and general obstetric precautions including but not limited to vaginal bleeding, contractions, leaking of fluid and fetal movement were reviewed in detail with the patient. Please refer to After Visit Summary for other counseling recommendations.   No follow-ups on file.  Future Appointments  Date Time Provider Department Center  10/29/2023  2:00 PM [facility] PROVIDER 1 [facility]  10/29/2023  2:30 PM [facility] US1 [facility]  11/12/2023  9:35 AM Ferdie Housekeeper, MD [facility]  11/26/2023  9:55 AM Ferdie Housekeeper, MD [facility]  12/01/2023 11:15 AM Ferdie Housekeeper, MD [facility]  12/08/2023 10:35 AM Abner Ables, MD [facility]  12/15/2023  9:35 AM Ferdie Housekeeper, MD [facility]  12/21/2023 10:15 AM Teena Feast, MD [facility]  12/21/2023 10:55 AM [facility] US2 [facility]    Ferdie Housekeeper, MD

## 2023-10-30 NOTE — Progress Notes (Signed)
 MFM Consult Note  Lauren Fernandez is currently at 33 weeks and 1 day.  She has been followed due to pyelectasis noted on her prior ultrasound exams.    She denies any problems since her last exam and has screened negative for gestational diabetes in her current pregnancy.  On today's exam, the overall EFW of 4 pounds 9 ounces measures at the 33rd percentile for her gestational age.    There was normal amniotic fluid noted with a total AFI of 19.81 cm.  Left pyelectasis measuring 0.7 to  0.8 cm dilated was noted on today's ultrasound exam. The right fetal kidney appeared within normal limits.  She was advised that this is most likely a normal finding at her current gestational age.  She was advised to notify her pediatrician after delivery regarding pyelectasis that was noted on her prenatal ultrasound exams.  Her pediatrician will order additional imaging studies of the baby's kidneys if necessary. No further exams were scheduled in our office.  The patient stated that all of her questions were answered today.  A total of 20 minutes was spent counseling and coordinating the care for this patient.  Greater than 50% of the time was spent in direct face-to-face contact.

## 2023-11-12 ENCOUNTER — Other Ambulatory Visit: Payer: Self-pay

## 2023-11-12 ENCOUNTER — Ambulatory Visit: Payer: PRIVATE HEALTH INSURANCE | Admitting: Family Medicine

## 2023-11-12 VITALS — BP 122/80 | HR 124 | Wt 233.6 lb

## 2023-11-12 DIAGNOSIS — Z3493 Encounter for supervision of normal pregnancy, unspecified, third trimester: Secondary | ICD-10-CM

## 2023-11-12 DIAGNOSIS — O99013 Anemia complicating pregnancy, third trimester: Secondary | ICD-10-CM | POA: Diagnosis not present

## 2023-11-12 DIAGNOSIS — D563 Thalassemia minor: Secondary | ICD-10-CM

## 2023-11-12 DIAGNOSIS — O163 Unspecified maternal hypertension, third trimester: Secondary | ICD-10-CM

## 2023-11-12 DIAGNOSIS — Z3A35 35 weeks gestation of pregnancy: Secondary | ICD-10-CM

## 2023-11-12 DIAGNOSIS — O283 Abnormal ultrasonic finding on antenatal screening of mother: Secondary | ICD-10-CM

## 2023-11-12 LAB — CBC
Hematocrit: 29.9 % — ABNORMAL LOW (ref 34.0–46.6)
Hemoglobin: 9.2 g/dL — ABNORMAL LOW (ref 11.1–15.9)
MCH: 26.1 pg — ABNORMAL LOW (ref 26.6–33.0)
MCHC: 30.8 g/dL — ABNORMAL LOW (ref 31.5–35.7)
MCV: 85 fL (ref 79–97)
Platelets: 241 10*3/uL (ref 150–450)
RBC: 3.53 x10E6/uL — ABNORMAL LOW (ref 3.77–5.28)
RDW: 13.1 % (ref 11.7–15.4)
WBC: 6.1 10*3/uL (ref 3.4–10.8)

## 2023-11-12 NOTE — Progress Notes (Addendum)
   PRENATAL VISIT NOTE  Subjective:  Lauren Fernandez is a 23 y.o. G1P0 at [redacted]w[redacted]d being seen today for ongoing prenatal care.  She is currently monitored for the following issues for this low-risk pregnancy and has Supervision of low-risk pregnancy; Alpha thalassemia silent carrier; GERD (gastroesophageal reflux disease); Mallory-Weiss tear; Anemia affecting pregnancy in third trimester; and Abnormal fetal ultrasound on their problem list.  Patient reports no leaking and occasional contractions.  Contractions: Irritability. Vag. Bleeding: None.  Movement: Present. Denies leaking of fluid.   The following portions of the patient's history were reviewed and updated as appropriate: allergies, current medications, past family history, past medical history, past social history, past surgical history and problem list.   Objective:    Vitals:   11/12/23 0949  BP: 122/80  Pulse: (!) 124  Weight: 233 lb 9 oz (105.9 kg)    Fetal Status:  Fetal Heart Rate (bpm): 158   Movement: Present    General: Alert, oriented and cooperative. Patient is in no acute distress.  Skin: Skin is warm and dry. No rash noted.   Cardiovascular: Normal heart rate noted  Respiratory: Normal respiratory effort, no problems with respiration noted  Abdomen: Soft, gravid, appropriate for gestational age.  Pain/Pressure: Absent     Pelvic: Cervical exam deferred        Extremities: Normal range of motion.  Edema: None  Mental Status: Normal mood and affect. Normal behavior. Normal judgment and thought content.   Assessment and Plan:  Pregnancy: G1P0 at [redacted]w[redacted]d 1. Encounter for supervision of low-risk pregnancy in third trimester (Primary) - BP appropriate today - Continue routine prenatal care  2. Elevated blood pressure affecting pregnancy in third trimester, antepartum - BP within normal limits  3. Anemia affecting pregnancy in third trimester - Repeat CBC today - Continue iron every other day  4. Abnormal fetal  ultrasound - Mild left pyelectasis 0.7 to 0.8 cm dilated on last ultrasound - She was reassured that this was likely a normal finding  5. Alpha thalassemia silent carrier - Partner is also an Alpha thalassemia silent carrier - Recommended patient to see genetic counseling  6. [redacted] weeks gestation of pregnancy   Preterm labor symptoms and general obstetric precautions including but not limited to vaginal bleeding, contractions, leaking of fluid and fetal movement were reviewed in detail with the patient. Please refer to After Visit Summary for other counseling recommendations.   No follow-ups on file.  Future Appointments  Date Time Provider Department Center  11/26/2023  9:55 AM Ferdie Housekeeper, MD [facility]  12/01/2023 11:15 AM Ferdie Housekeeper, MD [facility]  12/08/2023 10:35 AM Abner Ables, MD [facility]  12/15/2023  9:35 AM Ferdie Housekeeper, MD [facility]  12/21/2023 10:15 AM Teena Feast, MD [facility]  12/21/2023 10:55 AM [facility] US2 [facility]    Deedra Farr, Medical Student

## 2023-11-26 ENCOUNTER — Encounter: Payer: PRIVATE HEALTH INSURANCE | Admitting: Family Medicine

## 2023-12-01 ENCOUNTER — Other Ambulatory Visit (HOSPITAL_COMMUNITY)
Admission: RE | Admit: 2023-12-01 | Discharge: 2023-12-01 | Disposition: A | Discharge status: Home / Self Care | Source: Ambulatory Visit | Attending: Family Medicine | Admitting: Family Medicine

## 2023-12-01 ENCOUNTER — Ambulatory Visit: Payer: PRIVATE HEALTH INSURANCE | Admitting: Family Medicine

## 2023-12-01 ENCOUNTER — Other Ambulatory Visit: Payer: Self-pay

## 2023-12-01 ENCOUNTER — Encounter: Payer: Self-pay | Admitting: Family Medicine

## 2023-12-01 VITALS — BP 116/65 | HR 87 | Wt 236.6 lb

## 2023-12-01 DIAGNOSIS — D563 Thalassemia minor: Secondary | ICD-10-CM | POA: Diagnosis not present

## 2023-12-01 DIAGNOSIS — O283 Abnormal ultrasonic finding on antenatal screening of mother: Secondary | ICD-10-CM | POA: Diagnosis not present

## 2023-12-01 DIAGNOSIS — Z3A37 37 weeks gestation of pregnancy: Secondary | ICD-10-CM

## 2023-12-01 DIAGNOSIS — O99013 Anemia complicating pregnancy, third trimester: Secondary | ICD-10-CM | POA: Diagnosis not present

## 2023-12-01 DIAGNOSIS — O163 Unspecified maternal hypertension, third trimester: Secondary | ICD-10-CM

## 2023-12-01 DIAGNOSIS — Z3493 Encounter for supervision of normal pregnancy, unspecified, third trimester: Secondary | ICD-10-CM

## 2023-12-01 NOTE — Progress Notes (Signed)
   PRENATAL VISIT NOTE  Subjective:  Lauren Fernandez is a 23 y.o. G1P0 at [redacted]w[redacted]d being seen today for ongoing prenatal care.  She is currently monitored for the following issues for this low-risk pregnancy and has Supervision of low-risk pregnancy; Alpha thalassemia silent carrier; GERD (gastroesophageal reflux disease); Mallory-Weiss tear; Anemia affecting pregnancy in third trimester; and Abnormal fetal ultrasound on their problem list.  Patient reports no bleeding, no contractions, no cramping, and no leaking.  Contractions: Not present. Vag. Bleeding: None.  Movement: Present. Denies leaking of fluid.   The following portions of the patient's history were reviewed and updated as appropriate: allergies, current medications, past family history, past medical history, past social history, past surgical history and problem list.   Objective:    Vitals:   12/01/23 1133  BP: 116/65  Pulse: 87  Weight: 236 lb 9.6 oz (107.3 kg)    Fetal Status:  Fetal Heart Rate (bpm): 132   Movement: Present    General: Alert, oriented and cooperative. Patient is in no acute distress.  Skin: Skin is warm and dry. No rash noted.   Cardiovascular: Normal heart rate noted  Respiratory: Normal respiratory effort, no problems with respiration noted  Abdomen: Soft, gravid, appropriate for gestational age.  Pain/Pressure: Present     Pelvic: Cervical exam performed in the presence of a chaperone      1/thick/ballotable cephalic by ultrasound  Extremities: Normal range of motion.  Edema: None  Mental Status: Normal mood and affect. Normal behavior. Normal judgment and thought content.   Assessment and Plan:  Pregnancy: G1P0 at [redacted]w[redacted]d 1. Encounter for supervision of low-risk pregnancy in third trimester (Primary) FHR and BP appropriate today Cephalic by ultrasound - Culture, beta strep (group b only) - Cervicovaginal ancillary only  2. Elevated blood pressure affecting pregnancy in third trimester,  antepartum BP appropriate today  3. Anemia affecting pregnancy in third trimester Most recent hemoglobin 9.2 Continue iron every other day  4. Abnormal fetal ultrasound Mild left pyelectasis, no further follow-up needed  5. Alpha thalassemia silent carrier Partner is also silent carrier.  Recommended to see genetics  6. [redacted] weeks gestation of pregnancy - Culture, beta strep (group b only) - Cervicovaginal ancillary only  Term labor symptoms and general obstetric precautions including but not limited to vaginal bleeding, contractions, leaking of fluid and fetal movement were reviewed in detail with the patient. Please refer to After Visit Summary for other counseling recommendations.   No follow-ups on file.  Future Appointments  Date Time Provider Department Center  12/08/2023 10:35 AM Eldonna Suzen Octave, MD [facility]  12/15/2023  9:35 AM Ilean Norleen GAILS, MD [facility]  12/21/2023 10:15 AM Lola Donnice HERO, MD [facility]  12/21/2023 10:55 AM [facility] US2 [facility]    Norleen GAILS Ilean, MD

## 2023-12-02 LAB — CERVICOVAGINAL ANCILLARY ONLY
Chlamydia: NEGATIVE
Comment: NEGATIVE
Comment: NORMAL
Neisseria Gonorrhea: NEGATIVE

## 2023-12-04 LAB — CULTURE, BETA STREP (GROUP B ONLY): Strep Gp B Culture: NEGATIVE

## 2023-12-08 ENCOUNTER — Other Ambulatory Visit: Payer: Self-pay

## 2023-12-08 ENCOUNTER — Ambulatory Visit (INDEPENDENT_AMBULATORY_CARE_PROVIDER_SITE_OTHER): Payer: PRIVATE HEALTH INSURANCE | Admitting: Family Medicine

## 2023-12-08 VITALS — BP 112/75 | HR 77 | Wt 238.2 lb

## 2023-12-08 DIAGNOSIS — Z3493 Encounter for supervision of normal pregnancy, unspecified, third trimester: Secondary | ICD-10-CM

## 2023-12-08 DIAGNOSIS — Z3A38 38 weeks gestation of pregnancy: Secondary | ICD-10-CM

## 2023-12-08 DIAGNOSIS — O99013 Anemia complicating pregnancy, third trimester: Secondary | ICD-10-CM

## 2023-12-08 DIAGNOSIS — D563 Thalassemia minor: Secondary | ICD-10-CM

## 2023-12-08 NOTE — Progress Notes (Signed)
 Postdates IOL 7/24 midnight arriving at 11:45pm

## 2023-12-08 NOTE — Progress Notes (Signed)
   PRENATAL VISIT NOTE  Subjective:  Lauren Fernandez is a 23 y.o. G1P0 at [redacted]w[redacted]d being seen today for ongoing prenatal care.  She is currently monitored for the following issues for this high-risk pregnancy and has Supervision of low-risk pregnancy; Alpha thalassemia silent carrier; GERD (gastroesophageal reflux disease); Mallory-Weiss tear; Anemia affecting pregnancy in third trimester; and Abnormal fetal ultrasound on their problem list.  Patient reports no complaints.  Contractions: Irritability. Vag. Bleeding: None.  Movement: Present. Denies leaking of fluid.   The following portions of the patient's history were reviewed and updated as appropriate: allergies, current medications, past family history, past medical history, past social history, past surgical history and problem list.   Objective:    Vitals:   12/08/23 1058  BP: 112/75  Pulse: 77  Weight: 238 lb 3.2 oz (108 kg)    Fetal Status:  Fetal Heart Rate (bpm): 129   Movement: Present    General: Alert, oriented and cooperative. Patient is in no acute distress.  Skin: Skin is warm and dry. No rash noted.   Cardiovascular: Normal heart rate noted  Respiratory: Normal respiratory effort, no problems with respiration noted  Abdomen: Soft, gravid, appropriate for gestational age.  Pain/Pressure: Present     Pelvic: Cervical exam performed in the presence of a chaperone        Extremities: Normal range of motion.  Edema: None  Mental Status: Normal mood and affect. Normal behavior. Normal judgment and thought content.   Assessment and Plan:  Pregnancy: G1P0 at [redacted]w[redacted]d 1. [redacted] weeks gestation of pregnancy  2. Encounter for supervision of low-risk pregnancy in third trimester (Primary) Up to date FH appropriate Feeling vigorous movement Reviewed IOL at 41 weeks, would like 7/24 [place] IOL for postdates  Discussed membrane sweeping today. Reviewed cochrane review data on membrane sweeping at 39 wks and then at EDD. Reviewed risk  of cramping, contractions, bleeding and ROM. Answered patient questions and she agreed to proceed with procedure.  Patient also expressed desire for waterbirth, not previously addressed at visits.   - Pt is interested in waterbirth.  No contraindications at this time per chart review/patient assessment.   - Pt to enroll in class, see CNMs for most visits in the office.  - Discussed waterbirth as option for low-risk pregnancy.  Reviewed conditions that may arise during pregnancy that will risk pt out of waterbirth including hypertension, diabetes, fetal growth restriction <10%ile, etc.  Message sent to Josette Carton to help facilitate patient getting to attend 7/10 class as we were unable to schedule today in clinic   3. Anemia affecting pregnancy in third trimester  Lab Results  Component Value Date   HGB 9.2 (L) 11/12/2023   HGB 10.0 (L) 10/01/2023  Patient is on FE Took late to get Fe infusions at this point  4. Alpha thalassemia silent carrier  Preterm labor symptoms and general obstetric precautions including but not limited to vaginal bleeding, contractions, leaking of fluid and fetal movement were reviewed in detail with the patient. Please refer to After Visit Summary for other counseling recommendations.   Return in about 1 week (around 12/15/2023) for Routine prenatal care.  Future Appointments  Date Time Provider Department Center  12/15/2023  9:35 AM Ilean Norleen GAILS, MD [facility]  12/21/2023 10:15 AM Lola Donnice HERO, MD [facility]  12/21/2023 10:55 AM [facility] US2 [facility]    Suzen Maryan Masters, MD

## 2023-12-10 ENCOUNTER — Encounter: Payer: Self-pay | Admitting: Family Medicine

## 2023-12-12 ENCOUNTER — Inpatient Hospital Stay (HOSPITAL_COMMUNITY)
Admission: AD | Admit: 2023-12-12 | Discharge: 2023-12-15 | DRG: 806 | Disposition: A | Discharge status: Home / Self Care | Attending: Family Medicine | Admitting: Family Medicine

## 2023-12-12 ENCOUNTER — Other Ambulatory Visit: Payer: Self-pay

## 2023-12-12 ENCOUNTER — Inpatient Hospital Stay (HOSPITAL_COMMUNITY): Admitting: Anesthesiology

## 2023-12-12 ENCOUNTER — Encounter (HOSPITAL_COMMUNITY): Payer: Self-pay | Admitting: Obstetrics and Gynecology

## 2023-12-12 DIAGNOSIS — O9962 Diseases of the digestive system complicating childbirth: Secondary | ICD-10-CM | POA: Diagnosis present

## 2023-12-12 DIAGNOSIS — O358XX0 Maternal care for other (suspected) fetal abnormality and damage, not applicable or unspecified: Secondary | ICD-10-CM | POA: Diagnosis present

## 2023-12-12 DIAGNOSIS — Z3A39 39 weeks gestation of pregnancy: Secondary | ICD-10-CM | POA: Diagnosis not present

## 2023-12-12 DIAGNOSIS — D62 Acute posthemorrhagic anemia: Secondary | ICD-10-CM | POA: Diagnosis not present

## 2023-12-12 DIAGNOSIS — Z5982 Transportation insecurity: Secondary | ICD-10-CM

## 2023-12-12 DIAGNOSIS — O9081 Anemia of the puerperium: Secondary | ICD-10-CM | POA: Diagnosis not present

## 2023-12-12 DIAGNOSIS — O99013 Anemia complicating pregnancy, third trimester: Secondary | ICD-10-CM | POA: Diagnosis present

## 2023-12-12 DIAGNOSIS — O4292 Full-term premature rupture of membranes, unspecified as to length of time between rupture and onset of labor: Principal | ICD-10-CM | POA: Diagnosis present

## 2023-12-12 DIAGNOSIS — K219 Gastro-esophageal reflux disease without esophagitis: Secondary | ICD-10-CM | POA: Diagnosis present

## 2023-12-12 DIAGNOSIS — Z148 Genetic carrier of other disease: Secondary | ICD-10-CM

## 2023-12-12 DIAGNOSIS — D563 Thalassemia minor: Secondary | ICD-10-CM | POA: Diagnosis present

## 2023-12-12 DIAGNOSIS — O26893 Other specified pregnancy related conditions, third trimester: Secondary | ICD-10-CM | POA: Diagnosis present

## 2023-12-12 DIAGNOSIS — O4202 Full-term premature rupture of membranes, onset of labor within 24 hours of rupture: Secondary | ICD-10-CM | POA: Diagnosis not present

## 2023-12-12 LAB — COMPREHENSIVE METABOLIC PANEL WITH GFR
ALT: 14 U/L (ref 0–44)
AST: 22 U/L (ref 15–41)
Albumin: 2.6 g/dL — ABNORMAL LOW (ref 3.5–5.0)
Alkaline Phosphatase: 143 U/L — ABNORMAL HIGH (ref 38–126)
Anion gap: 11 (ref 5–15)
BUN: <5 mg/dL — ABNORMAL LOW (ref 6–20)
CO2: 18 mmol/L — ABNORMAL LOW (ref 22–32)
Calcium: 8.8 mg/dL — ABNORMAL LOW (ref 8.9–10.3)
Chloride: 106 mmol/L (ref 98–111)
Creatinine, Ser: 0.65 mg/dL (ref 0.44–1.00)
GFR, Estimated: >60 mL/min (ref >60)
Glucose, Bld: 118 mg/dL — ABNORMAL HIGH (ref 70–99)
Potassium: 3.6 mmol/L (ref 3.5–5.1)
Sodium: 135 mmol/L (ref 135–145)
Total Bilirubin: 0.4 mg/dL (ref 0.0–1.2)
Total Protein: 6.4 g/dL — ABNORMAL LOW (ref 6.5–8.1)

## 2023-12-12 LAB — TYPE AND SCREEN
ABO/RH(D): AB POS
Antibody Screen: NEGATIVE

## 2023-12-12 LAB — CBC
HCT: 32.2 % — ABNORMAL LOW (ref 36.0–46.0)
Hemoglobin: 9.6 g/dL — ABNORMAL LOW (ref 12.0–15.0)
MCH: 25.1 pg — ABNORMAL LOW (ref 26.0–34.0)
MCHC: 29.8 g/dL — ABNORMAL LOW (ref 30.0–36.0)
MCV: 84.3 fL (ref 80.0–100.0)
Platelets: 255 K/uL (ref 150–400)
RBC: 3.82 MIL/uL — ABNORMAL LOW (ref 3.87–5.11)
RDW: 15.7 % — ABNORMAL HIGH (ref 11.5–15.5)
WBC: 8 K/uL (ref 4.0–10.5)
nRBC: 0 % (ref 0.0–0.2)

## 2023-12-12 LAB — RUPTURE OF MEMBRANE (ROM)PLUS: Rom Plus: POSITIVE

## 2023-12-12 LAB — POCT FERN TEST: POCT Fern Test: NEGATIVE

## 2023-12-12 MED ORDER — FENTANYL CITRATE (PF) 100 MCG/2ML IJ SOLN: 50.0000 ug | INTRAMUSCULAR | Status: DC | PRN

## 2023-12-12 MED ORDER — SOD CITRATE-CITRIC ACID 500-334 MG/5ML PO SOLN: 30.0000 mL | ORAL | Status: DC | PRN

## 2023-12-12 MED ORDER — PHENYLEPHRINE 80 MCG/ML (10ML) SYRINGE FOR IV PUSH (FOR BLOOD PRESSURE SUPPORT): 80.0000 ug | PREFILLED_SYRINGE | INTRAVENOUS | Status: DC | PRN

## 2023-12-12 MED ORDER — ACETAMINOPHEN 325 MG PO TABS: 650.0000 mg | ORAL_TABLET | ORAL | Status: DC | PRN

## 2023-12-12 MED ORDER — LIDOCAINE HCL (PF) 1 % IJ SOLN
INTRAMUSCULAR | Status: DC | PRN
  Administered 2023-12-12: 8 mL via EPIDURAL

## 2023-12-12 MED ORDER — OXYCODONE-ACETAMINOPHEN 5-325 MG PO TABS: 2.0000 | ORAL_TABLET | ORAL | Status: DC | PRN

## 2023-12-12 MED ORDER — DIPHENHYDRAMINE HCL 50 MG/ML IJ SOLN: 12.5000 mg | INTRAMUSCULAR | Status: DC | PRN

## 2023-12-12 MED ORDER — FENTANYL-BUPIVACAINE-NACL 0.5-0.125-0.9 MG/250ML-% EP SOLN: 12.0000 mL/h | EPIDURAL | Status: DC | PRN

## 2023-12-12 MED ORDER — MISOPROSTOL 50MCG HALF TABLET
50.0000 ug | ORAL_TABLET | ORAL | Status: DC
  Administered 2023-12-12: 50 ug via ORAL
  Filled 2023-12-12: qty 1

## 2023-12-12 MED ORDER — OXYTOCIN-SODIUM CHLORIDE 30-0.9 UT/500ML-% IV SOLN
2.5000 [IU]/h | INTRAVENOUS | Status: DC
  Administered 2023-12-13: 2.5 [IU]/h via INTRAVENOUS
  Filled 2023-12-12: qty 500

## 2023-12-12 MED ORDER — OXYTOCIN BOLUS FROM INFUSION
333.0000 mL | Freq: Once | INTRAVENOUS | Status: AC
  Administered 2023-12-13: 333 mL via INTRAVENOUS

## 2023-12-12 MED ORDER — LIDOCAINE HCL (PF) 1 % IJ SOLN: 30.0000 mL | INTRAMUSCULAR | Status: DC | PRN

## 2023-12-12 MED ORDER — ONDANSETRON HCL 4 MG/2ML IJ SOLN
4.0000 mg | Freq: Four times a day (QID) | INTRAMUSCULAR | Status: DC | PRN
  Administered 2023-12-13: 4 mg via INTRAVENOUS
  Filled 2023-12-12: qty 2

## 2023-12-12 MED ORDER — FLEET ENEMA RE ENEM
1.0000 | ENEMA | RECTAL | Status: DC | PRN
Start: 2023-12-12 — End: 2023-12-13

## 2023-12-12 MED ORDER — PHENYLEPHRINE 80 MCG/ML (10ML) SYRINGE FOR IV PUSH (FOR BLOOD PRESSURE SUPPORT)
80.0000 ug | PREFILLED_SYRINGE | INTRAVENOUS | Status: AC | PRN
  Administered 2023-12-12 (×3): 80 ug via INTRAVENOUS
  Filled 2023-12-12: qty 10

## 2023-12-12 MED ORDER — OXYCODONE-ACETAMINOPHEN 5-325 MG PO TABS: 1.0000 | ORAL_TABLET | ORAL | Status: DC | PRN

## 2023-12-12 MED ORDER — MISOPROSTOL 25 MCG QUARTER TABLET
25.0000 ug | ORAL_TABLET | ORAL | Status: DC
  Administered 2023-12-12: 25 ug via VAGINAL
  Filled 2023-12-12: qty 1

## 2023-12-12 MED ORDER — TERBUTALINE SULFATE 1 MG/ML IJ SOLN: 0.2500 mg | Freq: Once | INTRAMUSCULAR | Status: DC | PRN

## 2023-12-12 MED ORDER — LACTATED RINGERS IV SOLN
500.0000 mL | Freq: Once | INTRAVENOUS | Status: AC
Start: 2023-12-12 — End: 2023-12-12
  Administered 2023-12-12: 500 mL via INTRAVENOUS

## 2023-12-12 MED ORDER — LACTATED RINGERS IV SOLN
500.0000 mL | INTRAVENOUS | Status: DC | PRN
  Administered 2023-12-13: 500 mL via INTRAVENOUS

## 2023-12-12 MED ORDER — OXYTOCIN-SODIUM CHLORIDE 30-0.9 UT/500ML-% IV SOLN
1.0000 m[IU]/min | INTRAVENOUS | Status: DC
  Administered 2023-12-12: 2 m[IU]/min via INTRAVENOUS

## 2023-12-12 MED ORDER — LACTATED RINGERS IV SOLN: INTRAVENOUS | Status: DC

## 2023-12-12 MED ORDER — EPHEDRINE 5 MG/ML INJ
10.0000 mg | INTRAVENOUS | Status: DC | PRN
  Filled 2023-12-12: qty 5

## 2023-12-12 MED ORDER — EPHEDRINE 5 MG/ML INJ
10.0000 mg | INTRAVENOUS | Status: DC | PRN
  Administered 2023-12-13: 10 mg via INTRAVENOUS

## 2023-12-12 MED ORDER — EPHEDRINE 5 MG/ML INJ: 10.0000 mg | INTRAVENOUS | Status: DC | PRN

## 2023-12-12 MED ORDER — FENTANYL-BUPIVACAINE-NACL 0.5-0.125-0.9 MG/250ML-% EP SOLN
12.0000 mL/h | EPIDURAL | Status: DC | PRN
  Administered 2023-12-12: 12 mL/h via EPIDURAL
  Filled 2023-12-12: qty 250

## 2023-12-12 MED ORDER — PHENYLEPHRINE 80 MCG/ML (10ML) SYRINGE FOR IV PUSH (FOR BLOOD PRESSURE SUPPORT)
80.0000 ug | PREFILLED_SYRINGE | INTRAVENOUS | Status: AC | PRN
  Administered 2023-12-13 (×2): 80 ug via INTRAVENOUS

## 2023-12-12 MED ORDER — LACTATED RINGERS IV SOLN
500.0000 mL | Freq: Once | INTRAVENOUS | Status: AC
  Administered 2023-12-13: 500 mL via INTRAVENOUS

## 2023-12-12 NOTE — Anesthesia Preprocedure Evaluation (Signed)
 Anesthesia Evaluation  Patient identified by MRN, date of birth, ID band Patient awake    Reviewed: Allergy & Precautions, H&P , NPO status , Patient's Chart, lab work & pertinent test results, reviewed documented beta blocker date and time   Airway Mallampati: I  TM Distance: >3 FB Neck ROM: full    Dental no notable dental hx. (+) Teeth Intact, Dental Advisory Given   Pulmonary asthma    Pulmonary exam normal breath sounds clear to auscultation       Cardiovascular negative cardio ROS Normal cardiovascular exam Rhythm:regular Rate:Normal     Neuro/Psych negative neurological ROS  negative psych ROS   GI/Hepatic Neg liver ROS,GERD  ,,  Endo/Other  negative endocrine ROS    Renal/GU negative Renal ROS  negative genitourinary   Musculoskeletal   Abdominal   Peds  Hematology  (+) Blood dyscrasia, anemia   Anesthesia Other Findings   Reproductive/Obstetrics (+) Pregnancy                              Anesthesia Physical Anesthesia Plan  ASA: 2  Anesthesia Plan: Epidural   Post-op Pain Management: Minimal or no pain anticipated   Induction:   PONV Risk Score and Plan:   Airway Management Planned: Natural Airway  Additional Equipment: Fetal Monitoring  Intra-op Plan:   Post-operative Plan:   Informed Consent: I have reviewed the patients History and Physical, chart, labs and discussed the procedure including the risks, benefits and alternatives for the proposed anesthesia with the patient or authorized representative who has indicated his/her understanding and acceptance.     Dental Advisory Given  Plan Discussed with: Anesthesiologist  Anesthesia Plan Comments: (Labs checked- platelets confirmed with RN in room. Fetal heart tracing, per RN, reported to be stable enough for sitting procedure. Discussed epidural, and patient consents to the procedure:  included risk of  possible headache,backache, failed block, allergic reaction, and nerve injury. This patient was asked if she had any questions or concerns before the procedure started.)        Anesthesia Quick Evaluation

## 2023-12-12 NOTE — Progress Notes (Signed)
 Patient ID: Lauren Fernandez, female   DOB: Apr 24, 2001, 23 y.o.   MRN: [id number]  Comfortable w epidural  BP 129/73, 140/78 (all normal prior) FHR 120s, +accels, no decels Ctx q 2-4 mins with Pit at 66mu/min Cx deferred  IUP@39 .3wks PROM IOL process  Continue to titrate Pit to achieve reg ctx & active labor Anticipate vag delivery  Lauren Fernandez 12/12/2023 9:13 PM

## 2023-12-12 NOTE — Anesthesia Procedure Notes (Signed)
 Epidural Patient location during procedure: OB Start time: 12/12/2023 6:08 PM End time: 12/12/2023 6:12 PM  Staffing Anesthesiologist: Mallory Manus, MD  Preanesthetic Checklist Completed: patient identified, IV checked, site marked, risks and benefits discussed, surgical consent, monitors and equipment checked, pre-op evaluation and timeout performed  Epidural Patient position: sitting Prep: DuraPrep and site prepped and draped Patient monitoring: continuous pulse ox and blood pressure Approach: midline Location: L4-L5 Injection technique: LOR air  Needle:  Needle type: Tuohy  Needle gauge: 17 G Needle length: 9 cm and 9 Needle insertion depth: 8 cm Catheter type: closed end flexible Catheter size: 19 Gauge Catheter at skin depth: 14 cm Test dose: negative  Assessment Events: blood not aspirated, no cerebrospinal fluid, injection not painful, no injection resistance, no paresthesia and negative IV test

## 2023-12-12 NOTE — Progress Notes (Signed)
 Patient ID: Lauren Fernandez, female   DOB: 04/23/2001, 23 y.o.   MRN: [id number]  BP 114/67   Pulse 86   Temp 98 F (36.7 C) (Oral)   Resp 16   Ht 5' 7 (1.702 m)   Wt 108.4 kg   LMP 03/11/2023   SpO2 100%   BMI 37.43 kg/m   Patient has been up ambulating in room and hall. Now feeling painful contractions. Requesting epidural. Anesthesia at bedside for epidural now. Plan to recheck cervix at 1900 to assess for Pitocin  start vs second dose of Cytotec .   Vernell Ruddle, [facility] 12/12/23 1806

## 2023-12-12 NOTE — Progress Notes (Signed)
 LABOR PROGRESS NOTE  Patient Name: Lauren Fernandez, female   DOB: 2000/06/06, 23 y.o.  MRN: [id number]  Called to the room for maternal hypotensive episode and fetal bradycardia.   2 doses phenylephrine , IVF bolus, d/c Pitocin , repositioned with improvement.   Dilation: 5 Effacement (%): 90 Cervical Position: Posterior Station: -2 Presentation: Vertex Exam by:: Dr. Loyola  EFM: baseline 115, no accels, late decels during hypotension - now resolved, moderate variability TOCO: no contractions  Pt and babe recovered.  Suspect vasovagal episode, thought pitocin  had just been increased to 10u so possible contributing factor. Did not bolus epidural prior to episode. Will give 30-60 min pitocin  break, then restart at 2u.   Mardy Loyola, MD

## 2023-12-12 NOTE — H&P (Cosign Needed Addendum)
 OBSTETRIC ADMISSION HISTORY AND PHYSICAL  Lauren Fernandez is a 23 y.o. female G1P0 with IUP at [redacted]w[redacted]d by LMP presenting for SROM and irregular contractions. First noticed leaking fluid this AM at 0430. She reports positive fetal movement. No VB, blurry vision, headache, or RUQ pain. She plans on breastfeeding. She desires birth control, but is unsure of method. Previous history of painful periods with nausea and vomiting. Considering pills vs hormonal IUD.   She received her prenatal care at [facility].   Dating: By LMP --->  Estimated Date of Delivery: 12/16/23  Sono:  @[redacted]w[redacted]d , CWD, anterior placenta, normal anatomy other than left pyelectasis 0.7-0.8 cm dilated, cephalic presentation, 2073g, 66% EFW   NURSING  PROVIDER  Office Location [facility] Dating by LMP  [facility] Model Mom-Baby Dyad Anatomy U/S Normal, f/u to complete anatomy and growth  Initiated care at  [employer]  English              LAB RESULTS   Support Person   Genetics NIPS: LR AFP: neg    NT/IT (FT only)     Carrier Screen Horizon: alpha thal carrier  Rhogam  AB/Positive/-- (01/07 1114) A1C/GTT Early HgbA1C: 5.5 Third trimester 2 hr GTT: normal  Flu Vaccine     TDaP Vaccine   Blood Type AB/Positive/-- (01/07 1114)  RSV Vaccine N/a Antibody Negative (01/07 1114)  COVID Vaccine  Rubella 2.35 (01/07 1114)  Feeding Plan breast RPR Non Reactive (01/07 1114)  Contraception Unsure  HBsAg Negative (01/07 1114)  Circumcision Boy, Yes  HIV Non Reactive (01/07 1114)  Pediatrician  MBCC HCVAb Non Reactive (01/07 1114)  Prenatal Classes     BTL Consent  Pap Diagnosis  Date Value Ref Range Status  06/08/2023   Final   - Negative for intraepithelial lesion or malignancy (NILM)    BTL Pre-payment  GC/CT Initial:  neg/neg 36wks:    VBAC Consent  GBS     BRx Optimized? [ ]  yes   [ ]  no    DME Rx [ N/a] BP cuff [ N/a] Weight Scale Waterbirth  [ ]  Class [ ]  Consent [ ]  CNM visit  PHQ9 & GAD7 [  ] new OB [  ]  28 weeks  [  ] 36 weeks Induction  [ ]  Orders Entered [ ] Foley Y/N     Prenatal History/Complications: fetal pyelectasis  Past Medical History: Past Medical History:  Diagnosis Date   Asthma    as a child   Chlamydia 2018   GERD (gastroesophageal reflux disease)    Gonorrhea 2018   Medical history non-contributory    Past Surgical History: Past Surgical History:  Procedure Laterality Date   NO PAST SURGERIES     Obstetrical History: OB History     Gravida  1   Para      Term      Preterm      AB      Living         SAB      IAB      Ectopic      Multiple      Live Births             Social History Social History   Socioeconomic History   Marital status: Significant Other    Spouse name: Not on file   Number of children: Not on file   Years  of education: Not on file   Highest education level: Not on file  Occupational History   Not on file  Tobacco Use   Smoking status: Never   Smokeless tobacco: Never  Vaping Use   Vaping status: Former   Substances: THC  Substance and Sexual Activity   Alcohol use: Not Currently    Comment: occasional   Drug use: Never   Sexual activity: Yes    Birth control/protection: None  Other Topics Concern   Not on file  Social History Narrative   Not on file   Social Drivers of Health   Financial Resource Strain: Not on file  Food Insecurity: No Food Insecurity (12/12/2023)   Hunger Vital Sign    Worried About Running Out of Food in the Last Year: Never true    Ran Out of Food in the Last Year: Never true  Transportation Needs: Unknown (12/12/2023)   PRAPARE - Administrator, Civil Service (Medical): Not on file    Lack of Transportation (Non-Medical): No  Recent Concern: Transportation Needs - Unmet Transportation Needs (12/12/2023)   PRAPARE - Administrator, Civil Service (Medical): Yes    Lack of Transportation (Non-Medical): Yes  Physical Activity: Not on file  Stress: Not  on file  Social Connections: Unknown (12/12/2023)   Social Connection and Isolation Panel    Frequency of Communication with Friends and Family: Patient declined    Frequency of Social Gatherings with Friends and Family: Patient declined    Attends Religious Services: Patient declined    Database administrator or Organizations: Not on file    Attends Banker Meetings: Patient declined    Marital Status: Patient declined   Family History: Family History  Problem Relation Age of Onset   Healthy Mother    Healthy Father    Stroke Maternal Uncle    Allergies: No Known Allergies  Medications Prior to Admission  Medication Sig Dispense Refill Last Dose/Taking   acetaminophen  (TYLENOL ) 650 MG CR tablet Take 650 mg by mouth every 8 (eight) hours as needed for pain.   12/12/2023 Morning   ferrous sulfate  325 (65 FE) MG EC tablet Take 1 tablet (325 mg total) by mouth every other day. 30 tablet 2 12/12/2023 Morning   omeprazole  (PRILOSEC  OTC) 20 MG tablet Take once daily 30-60 minutes before breakfast 60 tablet 3 12/12/2023 Morning   ondansetron  (ZOFRAN  ODT) 4 MG disintegrating tablet Take 1 tablet (4 mg total) by mouth every 8 (eight) hours as needed for nausea or vomiting. 20 tablet 0 12/12/2023 Morning   sucralfate  (CARAFATE ) 1 g tablet 3 times daily with meals as needed 90 tablet 3 12/12/2023 Morning   Prenatal Vit-Fe Phos-FA-Omega (VITAFOL  GUMMIES) 3.33-0.333-34.8 MG CHEW Chew 1 tablet by mouth daily. 90 tablet 5    Review of Systems   All systems reviewed and negative except as stated in HPI  Blood pressure (!) 130/58, pulse 95, temperature 98 F (36.7 C), temperature source Oral, resp. rate 16, height 5' 7 (1.702 m), weight 108.4 kg, last menstrual period 03/11/2023, SpO2 100%. General appearance: alert and cooperative Lungs: normal respiratory rate and effort Heart: regular rate Abdomen: soft, non-tender, gravid Pelvic: SVE Dilation: 3 Effacement (%): Thick Cervical  Position: Posterior Station: -3 Presentation: Vertex Exam by:: rachel,snm Extremities: no sign of DVT Presentation: cephalic  Fetal monitoring:  Baseline: 125 Variability: moderate Accelerations: 15x15 Decelerations: occasional variables  Uterine activity: irregular contractions per patient, TOCO shows irritability  Prenatal  labs ABO, Rh: --/--/PENDING (07/13 1130) Antibody: PENDING (07/13 1130) Rubella: 2.35 (01/07 1114) RPR: Non Reactive (05/02 0848)  HBsAg: Negative (01/07 1114)  HIV: Non Reactive (05/02 0848)  GBS: Negative/-- (07/02 1410)    Lab Results  Component Value Date   GBS Negative 12/01/2023   GTT normal (10/01/23) Genetic screening: -  low risk NIPS (06/08/23) -  silent carrier alpha-thal (06/08/23), partner also silent carrier alpha-thal -  negative AFP (07/09/23) Anatomy US  normal (07/23/23), renal pyelectasis seen on follow-up US   There is no immunization history on file for this patient.  Prenatal Transfer Tool  Maternal Diabetes: No Genetic Screening: Normal Maternal Ultrasounds/Referrals: Fetal renal pyelectasis Fetal Ultrasounds or other Referrals:  Referred to Materal Fetal Medicine  Maternal Substance Abuse:  No Significant Maternal Medications:  None Significant Maternal Lab Results: None Number of Prenatal Visits:greater than 3 verified prenatal visits Maternal Vaccinations: None Other Comments:  None   Results for orders placed or performed during the hospital encounter of 12/12/23 (from the past 24 hours)  Rupture of Membrane (ROM) Plus   Collection Time: 12/12/23  9:47 AM  Result Value Ref Range   Rom Plus POSITIVE   POCT fern test   Collection Time: 12/12/23 10:08 AM  Result Value Ref Range   POCT Fern Test Negative = intact amniotic membranes   Type and screen [facility]   Collection Time: 12/12/23 11:30 AM  Result Value Ref Range   ABO/RH(D) PENDING    Antibody Screen PENDING    Sample Expiration       12/15/2023,2359 Performed at [facility], [address] [place], [place] [address]     Patient Active Problem List   Diagnosis Date Noted   Labor and delivery, indication for care 12/12/2023   Anemia affecting pregnancy in third trimester 10/15/2023   Abnormal fetal ultrasound 10/15/2023   GERD (gastroesophageal reflux disease) 09/17/2023   Mallory-Weiss tear 09/17/2023   Alpha thalassemia silent carrier 07/09/2023   Supervision of low-risk pregnancy 06/03/2023    Assessment/Plan:  Lauren Fernandez is a 23 y.o. G1P0 at [redacted]w[redacted]d here following SROM at 0430 today. Desires augmentation of labor.  #Labor: cervix 3cm dilated and soft, vaginal Cytotec  placed during exam and oral Cytotec  given by RN, plan to recheck in 4 hours and consider next dose of Cytotec  vs Pitocin  #Pain: Planning Epidural #FWB: Overall Cat 1, occasional variable decel seen on tracing #GBS status: negative #Feeding: Breastmilk  #Reproductive Life planning: Unsure, considering pills vs hormonal IUD #Circ: yes  Vernell FORBES Ruddle, Student-MidWife  12/12/2023, 1547  CNM attestation:  I have seen and examined this patient; I agree with above documentation in the student midwife's note.   Lauren Fernandez is a 23 y.o. G1P0 here for IOL due to PROM at 0430  PE: BP (!) 140/78   Pulse 68   Temp 98.4 F (36.9 C) (Axillary)   Resp 16   Ht 5' 7 (1.702 m)   Wt 108.4 kg   LMP 03/11/2023   SpO2 100%   BMI 37.43 kg/m  Gen: calm comfortable, NAD Resp: normal effort, no distress Abd: gravid  ROS, labs, PMH reviewed  Plan: Admit to L&D Dual cytotec  to start for cervical thinning, followed by Pitocin  Anticipate vag delivery  Suzen JONETTA Gentry CNM 12/12/2023, 9:09 PM

## 2023-12-12 NOTE — MAU Note (Signed)
.  Lauren Fernandez is a 23 y.o. at [redacted]w[redacted]d here in MAU reporting: LOF and irregular contractions that started around 0430 this morning. Pt noticed a small amount of fluid around that time also. +FM and denies vaginal bleeding.   Onset of complaint: 7/10 Pain score: 9/10 Vitals:   12/12/23 0915  BP: 137/67  Pulse: (!) 104  Resp: 18  Temp: 98.1 F (36.7 C)  SpO2: 100%     FHT: 120  Lab orders placed from triage: UA

## 2023-12-12 NOTE — MAU Provider Note (Signed)
 S: Ms. Lauren Fernandez is a 23 y.o. G1P0 at [redacted]w[redacted]d  who presents to MAU today complaining of leaking of fluid since 0430. She denies vaginal bleeding. She endorses contractions. She reports normal fetal movement.    O: BP (!) 124/57   Pulse 84   Temp 98 F (36.7 C) (Oral)   Resp 16   Ht 5' 7 (1.702 m)   Wt 108.4 kg   LMP 03/11/2023   SpO2 100%   BMI 37.43 kg/m  GENERAL: Well-developed, well-nourished female in no acute distress.  HEAD: Normocephalic, atraumatic.  CHEST: Normal effort of breathing, regular heart rate ABDOMEN: Soft, nontender, gravid PELVIC: deferred   Cervical exam:  Dilation: 3 Effacement (%): Thick Cervical Position: Posterior Station: -3 Presentation: Vertex Exam by:: rachel,snm   Fetal Monitoring: Category 1 fetal tracing.   Results for orders placed or performed during the hospital encounter of 12/12/23 (from the past 24 hours)  Rupture of Membrane (ROM) Plus     Status: None   Collection Time: 12/12/23  9:47 AM  Result Value Ref Range   Rom Plus POSITIVE   POCT fern test     Status: Normal   Collection Time: 12/12/23 10:08 AM  Result Value Ref Range   POCT Fern Test Negative = intact amniotic membranes   Type and screen [facility]     Status: None   Collection Time: 12/12/23 11:30 AM  Result Value Ref Range   ABO/RH(D) AB POS    Antibody Screen NEG    Sample Expiration      12/15/2023,2359 Performed at [facility], [address], [place], [place] [address]   CBC     Status: Abnormal   Collection Time: 12/12/23 11:39 AM  Result Value Ref Range   WBC 8.0 4.0 - 10.5 K/uL   RBC 3.82 (L) 3.87 - 5.11 MIL/uL   Hemoglobin 9.6 (L) 12.0 - 15.0 g/dL   HCT 67.7 (L) 63.9 - 53.9 %   MCV 84.3 80.0 - 100.0 fL   MCH 25.1 (L) 26.0 - 34.0 pg   MCHC 29.8 (L) 30.0 - 36.0 g/dL   RDW 84.2 (H) 88.4 - 84.4 %   Platelets 255 150 - 400 K/uL   nRBC 0.0 0.0 - 0.2 %  Comprehensive metabolic panel     Status: Abnormal   Collection Time:  12/12/23 11:39 AM  Result Value Ref Range   Sodium 135 135 - 145 mmol/L   Potassium 3.6 3.5 - 5.1 mmol/L   Chloride 106 98 - 111 mmol/L   CO2 18 (L) 22 - 32 mmol/L   Glucose, Bld 118 (H) 70 - 99 mg/dL   BUN <5 (L) 6 - 20 mg/dL   Creatinine, Ser 9.34 0.44 - 1.00 mg/dL   Calcium 8.8 (L) 8.9 - 10.3 mg/dL   Total Protein 6.4 (L) 6.5 - 8.1 g/dL   Albumin 2.6 (L) 3.5 - 5.0 g/dL   AST 22 15 - 41 U/L   ALT 14 0 - 44 U/L   Alkaline Phosphatase 143 (H) 38 - 126 U/L   Total Bilirubin 0.4 0.0 - 1.2 mg/dL   GFR, Estimated >39 >39 mL/min   Anion gap 11 5 - 15     A: SIUP at [redacted]w[redacted]d  SROM  P:  Admit to labor and delivery Patient with ROM and painful contractions likely in early labor.  RN to notify RN.   Dorita Delon FERNS, NP 12/12/2023 3:52 PM

## 2023-12-12 NOTE — Progress Notes (Signed)
 Patient ID: Lauren Fernandez, female   DOB: [birth year], 23 y.o.   MRN: [id number]  Vitals:   12/12/23 1847 12/12/23 1901  BP: 127/74 139/84  Pulse: 78 70  Resp:    Temp:    SpO2: 100%    Dilation: 3.5 Effacement (%): 50 Cervical Position: Posterior Station: -2 Presentation: Vertex Exam by:: Greig Meyers, RN  Resting comfortably in bed with epidural. RN cervical exam shows thinning of cervix since last exam and vertex presentation. Discussed beginning Pitocin  with patient and family; agreeable with plan. Verbal order given for Pitocin  2x2 given. FHR Cat 1. UC q 2-4 min.  Vernell Ruddle, [facility] 12/12/23 [phone]

## 2023-12-13 ENCOUNTER — Encounter (HOSPITAL_COMMUNITY): Payer: Self-pay | Admitting: Obstetrics and Gynecology

## 2023-12-13 DIAGNOSIS — O4202 Full-term premature rupture of membranes, onset of labor within 24 hours of rupture: Secondary | ICD-10-CM

## 2023-12-13 DIAGNOSIS — Z3A39 39 weeks gestation of pregnancy: Secondary | ICD-10-CM

## 2023-12-13 LAB — RPR: RPR Ser Ql: NONREACTIVE

## 2023-12-13 MED ORDER — DIPHENHYDRAMINE HCL 25 MG PO CAPS: 25.0000 mg | ORAL_CAPSULE | Freq: Four times a day (QID) | ORAL | Status: DC | PRN

## 2023-12-13 MED ORDER — WITCH HAZEL-GLYCERIN EX PADS: 1.0000 | MEDICATED_PAD | CUTANEOUS | Status: DC | PRN

## 2023-12-13 MED ORDER — ONDANSETRON HCL 4 MG PO TABS: 4.0000 mg | ORAL_TABLET | ORAL | Status: DC | PRN

## 2023-12-13 MED ORDER — TETANUS-DIPHTH-ACELL PERTUSSIS 5-2.5-18.5 LF-MCG/0.5 IM SUSY: 0.5000 mL | PREFILLED_SYRINGE | Freq: Once | INTRAMUSCULAR | Status: DC

## 2023-12-13 MED ORDER — COCONUT OIL OIL: 1.0000 | TOPICAL_OIL | Status: DC | PRN

## 2023-12-13 MED ORDER — ZOLPIDEM TARTRATE 5 MG PO TABS: 5.0000 mg | ORAL_TABLET | Freq: Every evening | ORAL | Status: DC | PRN

## 2023-12-13 MED ORDER — OXYCODONE HCL 5 MG PO TABS: 10.0000 mg | ORAL_TABLET | ORAL | Status: DC | PRN

## 2023-12-13 MED ORDER — PRENATAL MULTIVITAMIN CH
1.0000 | ORAL_TABLET | Freq: Every day | ORAL | Status: DC
Start: 2023-12-13 — End: 2023-12-16
  Administered 2023-12-13 – 2023-12-15 (×3): 1 via ORAL
  Filled 2023-12-13 (×3): qty 1

## 2023-12-13 MED ORDER — BISACODYL 10 MG RE SUPP: 10.0000 mg | Freq: Every day | RECTAL | Status: DC | PRN

## 2023-12-13 MED ORDER — DIBUCAINE (PERIANAL) 1 % EX OINT: 1.0000 | TOPICAL_OINTMENT | CUTANEOUS | Status: DC | PRN

## 2023-12-13 MED ORDER — BENZOCAINE-MENTHOL 20-0.5 % EX AERO
1.0000 | INHALATION_SPRAY | CUTANEOUS | Status: DC | PRN
  Filled 2023-12-13: qty 56

## 2023-12-13 MED ORDER — OXYCODONE HCL 5 MG PO TABS: 5.0000 mg | ORAL_TABLET | ORAL | Status: DC | PRN

## 2023-12-13 MED ORDER — ONDANSETRON HCL 4 MG/2ML IJ SOLN: 4.0000 mg | INTRAMUSCULAR | Status: DC | PRN

## 2023-12-13 MED ORDER — SENNOSIDES-DOCUSATE SODIUM 8.6-50 MG PO TABS
2.0000 | ORAL_TABLET | Freq: Every day | ORAL | Status: DC
  Administered 2023-12-14: 2 via ORAL
  Filled 2023-12-13 (×2): qty 2

## 2023-12-13 MED ORDER — IBUPROFEN 600 MG PO TABS
600.0000 mg | ORAL_TABLET | Freq: Four times a day (QID) | ORAL | Status: DC
  Administered 2023-12-13 – 2023-12-15 (×8): 600 mg via ORAL
  Filled 2023-12-13 (×8): qty 1

## 2023-12-13 MED ORDER — MEDROXYPROGESTERONE ACETATE 150 MG/ML IM SUSP: 150.0000 mg | INTRAMUSCULAR | Status: DC | PRN

## 2023-12-13 MED ORDER — ACETAMINOPHEN 325 MG PO TABS: 650.0000 mg | ORAL_TABLET | ORAL | Status: DC | PRN

## 2023-12-13 MED ORDER — FLEET ENEMA RE ENEM: 1.0000 | ENEMA | Freq: Every day | RECTAL | Status: DC | PRN

## 2023-12-13 MED ORDER — SIMETHICONE 80 MG PO CHEW: 80.0000 mg | CHEWABLE_TABLET | ORAL | Status: DC | PRN

## 2023-12-13 NOTE — Progress Notes (Signed)
 Patient ID: Lauren Fernandez, female   DOB: March 26, 2001, 23 y.o.   MRN: [id number]  S/p episode of hypotension at 2230 resulting in FHR decel pattern x 7-8 mins; Pit was stopped until 0000   BPs 123/62, 95/60 T 98.4 FHR 115, +accels, no decels Ctx irreg 2-6 mins with Pit at 51mu/min Cx was 5/90/vtx -2 by [place] during decel  IUP@39 .4wks PROM x 22h IOL process  Continue to uptitrate Pit to achieve active labor Monitor for infection/minimize exams Anticipate vag delivery  Suzen JONETTA Gentry CNM 12/13/2023

## 2023-12-13 NOTE — Anesthesia Postprocedure Evaluation (Signed)
 Anesthesia Post Note  Patient: Lauren Fernandez  Procedure(s) Performed: AN AD HOC LABOR EPIDURAL     Patient location during evaluation: Mother Baby Anesthesia Type: Epidural Level of consciousness: awake and alert Pain management: pain level controlled Vital Signs Assessment: post-procedure vital signs reviewed and stable Respiratory status: spontaneous breathing, nonlabored ventilation and respiratory function stable Cardiovascular status: stable Postop Assessment: no headache, no backache and epidural receding Anesthetic complications: no   No notable events documented.  Last Vitals:  Vitals:   12/13/23 1400 12/13/23 1800  BP: 121/66 131/66  Pulse: 70 81  Resp: 18 20  Temp: 36.7 C 36.8 C  SpO2: 100% 100%    Last Pain:  Vitals:   12/13/23 2045  TempSrc:   PainSc: 0-No pain   Pain Goal:                   [name]

## 2023-12-13 NOTE — Lactation Note (Signed)
 This note was copied from a baby's chart. Lactation Consultation Note  Patient Name: Lauren Fernandez Date: 12/13/2023 Age:23 hours Reason for consult: Term;Initial assessment;1st time breastfeeding;Primapara  P1, 39 wks, @ 3 hrs of life. Per mom baby latched and fed downstairs- maybe ten minutes. Infant showing feeding cues, mom receptive to putting baby to breast. Demonstrated cross-cradle to right breast, starting with hand expression, and steps of latching. Infant responds well- needs to work on big mouth- encouraged mom that's very common in first days. Infant works well @ breast for 10 minutes- falls asleep content @ breast. Demonstrated hand expression- 1/5 a spoonful (1 ml) easily collected in 3-4 hand expressions. Assured mom her colostrum is great and baby is likely getting a full tummy in 10 minutes time. Discussed expectations @ breast- Day 1- sleepy/ feed every 3 hrs/ even 10 minutes is okay, Day 2 more awake/ feeding cues/longer feeds, and cluster feeding overnights brings milk in. Highlighted breast stimulation is tied directly to milk production. Discussed hands on breast and baby, keeping baby awake @ breast. Starting with hand expression & breast compression to get baby working @ breast, and gentle stimulation to keep baby working @ breast.  LC services and milk storage shared. Encouraged mom to call for assist anytime desired.  Maternal Data Has patient been taught Hand Expression?: Yes Does the patient have breastfeeding experience prior to this delivery?: No  Feeding Mother's Current Feeding Choice: Breast Milk  LATCH Score Latch: Grasps breast easily, tongue down, lips flanged, rhythmical sucking.  Audible Swallowing: Spontaneous and intermittent  Type of Nipple: Everted at rest and after stimulation  Comfort (Breast/Nipple): Soft / non-tender  Hold (Positioning): Assistance needed to correctly position infant at breast and maintain latch.  LATCH Score:  9   Lactation Tools Discussed/Used    Interventions Interventions: Breast feeding basics reviewed;Assisted with latch;Hand express;Breast compression;Hand pump;Education;LC Services brochure;CDC milk storage guidelines  Discharge Pump: DEBP;Manual;Personal  Consult Status Consult Status: Follow-up Date: 12/14/23 Follow-up type: In-patient    [name] 12/13/2023, 1:42 PM

## 2023-12-13 NOTE — Lactation Note (Signed)
 This note was copied from a baby's chart. Lactation Consultation Note  Patient Name: Boy Nakina Spatz Unijb'd Date: 12/13/2023 Age:23 hours Reason for consult: Follow-up assessment;Primapara;Term Mom stated baby hadn't been long finished feeding and has started feeding well. Mom stated he BF for 10 months on each side. Mom is excited. Reviewed newborn feeding habits and behavior. Encouraged mom to call for [facility] for next feeding. Praised mom for good feeding.  Maternal Data    Feeding    LATCH Score Latch: Repeated attempts needed to sustain latch, nipple held in mouth throughout feeding, stimulation needed to elicit sucking reflex.  Audible Swallowing: A few with stimulation  Type of Nipple: Everted at rest and after stimulation  Comfort (Breast/Nipple): Soft / non-tender  Hold (Positioning): No assistance needed to correctly position infant at breast.  LATCH Score: 8   Lactation Tools Discussed/Used    Interventions Interventions: Education  Discharge    Consult Status Consult Status: Follow-up Date: 12/14/23 Follow-up type: In-patient    Daniele Dillow G 12/13/2023, 9:54 PM

## 2023-12-13 NOTE — Lactation Note (Signed)
 This note was copied from a baby's chart. Lactation Consultation Note  Patient Name: Lauren Fernandez Date: 12/13/2023 Age:23 hours Reason for consult: Follow-up assessment;RN request;Term  P1, 39 wks, @ 6 hrs of life. Mom worried baby gagging, reassured mom- baby making a dirty diaper- common with stooling. Reassured mom she did all the right things- sitting baby up, asking for help. Reassured mom- baby sleepy, spoon sized stomach, he's acting just like he's suppose to when he needs encouragement to eat. Baby will be more awake tomorrow. Demonstrated hand expression, easily collected a spoonful in a couple minutes- fed baby off spoon. Reassured mom this is a meal and to feed again in 3 hours.   Maternal Data Has patient been taught Hand Expression?: Yes Does the patient have breastfeeding experience prior to this delivery?: No  Feeding Mother's Current Feeding Choice: Breast Milk  LATCH Score Latch: Too sleepy or reluctant, no latch achieved, no sucking elicited.  Audible Swallowing: Spontaneous and intermittent  Type of Nipple: Everted at rest and after stimulation  Comfort (Breast/Nipple): Soft / non-tender  Hold (Positioning): Assistance needed to correctly position infant at breast and maintain latch.  LATCH Score: 9   Lactation Tools Discussed/Used    Interventions Interventions: Hand express;Education  Discharge Pump: Manual;DEBP;Personal  Consult Status Consult Status: Follow-up Date: 12/14/23 Follow-up type: In-patient    Rykar Lebleu 12/13/2023, 4:55 PM

## 2023-12-13 NOTE — Progress Notes (Signed)
 Patient ID: Lauren Fernandez, female   DOB: 06-May-2001, 23 y.o.   MRN: [id number]  Feeling some ctx more/some pressure; just rec'd Zofran  for nausea; FHR 115-120, some variables, avg LTV; ctx q 2-4 mins with Pit @ 35mu/min; cx at 0350 was 7/90/vtx -1; up in throne position now; VSS  Lauren Fernandez CNM 12/13/2023 4:29 AM

## 2023-12-13 NOTE — Progress Notes (Signed)
 Patient ID: Lauren Fernandez, female   DOB: [birth year], 23 y.o.   MRN: [id number]  Comfortable now; resting; mild pressure  BPs 130/69 FHR 115-120, +accels, occ mi variables Ctx q 2-4 mins with Pit at 15mu/min Cx 9/90/0 @ 0630 by RN  IUP@39 .4wks Active labor  Plan to examine cx in next hour or two, or sooner w urge to push Anticipate vag delivery  Suzen JONETTA Gentry CNM 12/13/2023

## 2023-12-14 NOTE — Progress Notes (Signed)
 Circumcision Consent   Discussed with mom at bedside about circumcision.    Circumcision is a surgery that removes the skin that covers the tip of the penis, called the foreskin. Circumcision is usually done when a boy is between 1 and 59 days old, sometimes up to 57-49 weeks old.   The most common reasons boys are circumcised include for cultural/religious beliefs or for parental preference (potentially easier to clean, so baby looks like daddy, etc).   There may be some medical benefits for circumcision:    Circumcised boys seem to have slightly lower rates of: ? Urinary tract infections (per the American Academy of Pediatrics an uncircumcised boy has a 1/100 chance of developing a UTI in the first year of life, a circumcised boy at a 06/998 chance of developing a UTI in the first year of life- a 10% reduction) ? Penis cancer (typically rare- an uncircumcised female has a 1 in 100,000 chance of developing cancer of the penis) ? Sexually transmitted infection (in endemic areas, including HIV, HPV and Herpes- circumcision does NOT protect against gonorrhea, chlamydia, trachomatis, or syphilis) ? Phimosis: a condition where that makes retraction of the foreskin over the glans impossible (0.4 per 1000 boys per year or 0.6% of boys are affected by their 15th birthday)   Boys and men who are not circumcised can reduce these extra risks by: ? Cleaning their penis well ? Using condoms during sex   What are the risks of circumcision?   As with any surgical procedure, there are risks and complications. In circumcision, complications are rare and usually minor, the most common being: ? Bleeding- risk is reduced by holding each clamp for 30 seconds prior to a cut being made, and by holding pressure after the procedure is done ? Infection- the penis is cleaned prior to the procedure, and the procedure is done under sterile technique ? Damage to the urethra or amputation of the penis   How is  circumcision done in baby boys?   The baby will be placed on a special table and the legs restrained for their safety. Numbing medication is injected into the penis, and the skin is cleansed with betadine to decrease the risk of infection.    What to expect:   The penis will look red and raw for 5-7 days as it heals. We expect scabbing around where the cut was made, as well as clear-pink fluid and some swelling of the penis right after the procedure. If your baby's circumcision starts to bleed or develops pus, please contact your pediatrician immediately.   All questions were answered and mother consented.     Camie DELENA Rote, CNM FMOB Fellow, Faculty practice [facility], Center for [facility] 12/14/2023, 7:33 AM

## 2023-12-14 NOTE — Patient Instructions (Signed)
 Your appointment with Outpatient Lactation is: Date:12/29/2023 Time: 2:00 PM [facility] (First Floor) [address] [place]  Check in under baby's name.  Please bring your baby hungry along with your pump and a bottle of either formula or expressed breast milk. Please also bring your pump flanges and we welcome support people! If you need lactation assistance before your appointment, please call [phone] and press [phone] for lactation.  Lactation support groups:  [facility], Tuesdays 10:00 am -12:00 pm at [address] on the second floor in the conference room, lactating parents and lap babies welcome.  [email or web address]  [email or web address]

## 2023-12-14 NOTE — Progress Notes (Signed)
 POSTPARTUM PROGRESS NOTE  Subjective: Skyah Hannon is a 23 y.o. G1P1001 s/p NVSBat [redacted]w[redacted]d.  She reports she doing well. No acute events overnight. She denies any problems with ambulating, voiding or po intake. Denies nausea or vomiting. She has  passed flatus. Pain is well controlled.  Lochia is small.  Objective: Blood pressure 118/64, pulse 60, temperature 98.2 F (36.8 C), temperature source Oral, resp. rate 16, height 5' 7 (1.702 m), weight 108.4 kg, last menstrual period 03/11/2023, SpO2 100%, unknown if currently breastfeeding.  Physical Exam:  General: alert, cooperative and no distress Chest: no respiratory distress Abdomen: soft, non-tender  Uterine Fundus: firm, appropriately tender Extremities: No calf swelling or tenderness  trace edema  Recent Labs    12/12/23 1139  HGB 9.6*  HCT 32.2*    Assessment/Plan: Adna Nofziger is a 23 y.o. G1P1001 s/p NVSB at [redacted]w[redacted]d.  Routine Postpartum Care: Doing well, pain well-controlled.  -- Continue routine care, lactation support  -- Contraception: nothing for now -- Feeding: breast/bottle  Dispo: Plan for discharge 12/15/2023.  Camie Rote, MSN, CNM, RNC-OB Certified Nurse Midwife, [facility] 12/14/2023 7:32 AM

## 2023-12-14 NOTE — Lactation Note (Signed)
 This note was copied from a baby's chart. Lactation Consultation Note  Patient Name: Lauren Fernandez Date: 12/14/2023 Age:23 hours Reason for consult: Follow-up assessment;1st time breastfeeding;Infant < 6lbs (Change in weight from 2670g to 2750 g today at 30 hours of life.)  MOB feels infant is breastfeeding well today he is breastfeeding for 25 to 30 minutes, she has no latch concerns for LC. Infant recently breastfeed for 25 minutes prior to [facility] entering the room. MOB notice that infant cluster feeding lately. MOB has been set up with DEBP but not using it due to feeling flanges are not correct. LC assisted with flange fitting going from hand pump to DEBP. LC observed that MOB right breast needs 21 mm breast flange and the left breast is 18 mm. MOB informed LC the flanges felt better when pumping, MOB had expressed 5 mls of colostrum in flange and was still expressing when LC left the room. MOB knows that her EBM is safe for 4 hours at room temperature. Per MOB, she has hand express and given infant colostrum by spoon. MOB plans to continue to spoon feed infant any colostrum that she expresses on day 2 instead of offering a bottle nipple.   Day 2 Feeding plan: 1- MOB will continue to breastfeed infant by cues, on demand, 8-12 times within 24 hours, skin to skin. 2- MOB knows after latching infant at the breast she can give extra volume of colostrum  due infant cluster feeding at her discretion from: hand expression, hand pump or DEBP if she wants, MOB plans to  continue to spoon feed  infant any extra volume after latching him at the breast on Day 2 of life. 3- MOB knows to call if she has any breastfeeding questions, concerns or needs latch assistance.  Maternal Data    Feeding Mother's Current Feeding Choice: Breast Milk  LATCH Score                    Lactation Tools Discussed/Used Tools: Pump;Flanges Flange Size: 21;18 (LC assisted MOB with flange fitting, MOB  is using 21 mm breastflange for her right breast and 18 mm breast flange for her left breast.) Breast pump type: Double-Electric Breast Pump Pump Education: Setup, frequency, and cleaning;Milk Storage Reason for Pumping: Infant is less than 6 lbs and now starting to cluster feed. MOB will limit breastfeed for 30 to 35 minutes due infant's size Pumping frequency: MOB will pump at her discretion due infant latching well at the breast, will pump when she feels he wants more. Pumped volume: 5 mL (MOB was still expressing colostrum in breast flanges as LC left the room. MOB is spoon feeding the clostrum to infant.)  Interventions Interventions: Expressed milk;Education;DEBP;Hand pump  Discharge    Consult Status Consult Status: Follow-up Date: 12/15/23 Follow-up type: In-patient    Grayce LULLA Batter 12/14/2023, 4:55 PM

## 2023-12-15 ENCOUNTER — Other Ambulatory Visit (HOSPITAL_COMMUNITY): Payer: Self-pay

## 2023-12-15 ENCOUNTER — Encounter: Payer: PRIVATE HEALTH INSURANCE | Admitting: Family Medicine

## 2023-12-15 MED ORDER — LIDOCAINE 1% INJECTION FOR CIRCUMCISION
0.8000 mL | INJECTION | Freq: Once | INTRAVENOUS | Status: DC
  Filled 2023-12-15: qty 1

## 2023-12-15 MED ORDER — SENNOSIDES-DOCUSATE SODIUM 8.6-50 MG PO TABS
2.0000 | ORAL_TABLET | Freq: Every day | ORAL | 0 refills | Status: AC
  Filled 2023-12-15: qty 60, 30d supply, fill #0

## 2023-12-15 MED ORDER — BENZOCAINE-MENTHOL 20-0.5 % EX AERO
1.0000 | INHALATION_SPRAY | CUTANEOUS | 3 refills | Status: AC | PRN
  Filled 2023-12-15: qty 85, 14d supply, fill #0

## 2023-12-15 MED ORDER — IBUPROFEN 600 MG PO TABS
600.0000 mg | ORAL_TABLET | Freq: Four times a day (QID) | ORAL | 0 refills | Status: AC
  Filled 2023-12-15: qty 30, 8d supply, fill #0

## 2023-12-15 MED ORDER — VASELINE PETROLATUM GAUZE EX PADS: 1.0000 | MEDICATED_PAD | Freq: Once | CUTANEOUS | Status: DC | PRN

## 2023-12-15 MED ORDER — WHITE PETROLATUM EX OINT
1.0000 | TOPICAL_OINTMENT | CUTANEOUS | Status: DC | PRN
Start: 2023-12-15 — End: 2023-12-16

## 2023-12-15 MED ORDER — SUCROSE 24% NICU/PEDS ORAL SOLUTION: 0.5000 mL | OROMUCOSAL | Status: DC | PRN

## 2023-12-15 MED ORDER — EPINEPHRINE TOPICAL FOR CIRCUMCISION 0.1 MG/ML
1.0000 [drp] | TOPICAL | Status: DC | PRN
Start: 2023-12-15 — End: 2023-12-15

## 2023-12-15 NOTE — Discharge Summary (Signed)
 Postpartum Discharge Summary     Patient Name: Lauren Fernandez DOB: 05/13/2001 MRN: [id number]  Date of admission: 12/12/2023 Delivery date:12/13/2023 Delivering provider: TERRI LACKS Date of discharge: 12/15/2023  Admitting diagnosis: Labor and delivery, indication for care [O75.9] Intrauterine pregnancy: [redacted]w[redacted]d     Secondary diagnosis:  Principal Problem:   Labor and delivery, indication for care Active Problems:   Alpha thalassemia silent carrier   Anemia affecting pregnancy in third trimester  Additional problems: Prolonged ROM    Discharge diagnosis: Term Pregnancy Delivered                                              Post partum procedures:N/A Augmentation: Pitocin  Complications: None  Hospital course: Onset of Labor With Vaginal Delivery      23 y.o. yo G1P1001 at 100w4d was admitted for SROM on 12/12/2023. Labor course was complicated by prolonged ROM  Membrane Rupture Time/Date: 6:00 AM,12/12/2023  Delivery Method:Vaginal, Spontaneous Operative Delivery:N/A Episiotomy: None Lacerations:  None Patient had a postpartum course complicated by nothing.  She is ambulating, tolerating a regular diet, passing flatus, and urinating well. Patient is discharged home in stable condition on 12/15/23.  Newborn Data: Birth date:12/13/2023 Birth time:10:06 AM Gender:Female Living status:Living Apgars:9 ,9  Weight:2670 g  Magnesium  Sulfate received: No BMZ received: No Rhophylac:N/A MMR:N/A T-DaP:Given prenatally Flu: No RSV Vaccine received: No Transfusion:No  Immunizations received: There is no immunization history for the selected administration types on file for this patient.  Physical exam  Vitals:   12/14/23 0300 12/14/23 1510 12/14/23 2054 12/15/23 0541  BP: 118/64 127/80 133/61 124/67  Pulse: 60 67 73 81  Resp: 16 17 17 17   Temp: 98.2 F (36.8 C) 98 F (36.7 C) 98.4 F (36.9 C) 98.1 F (36.7 C)  TempSrc: Oral Oral Oral Oral  SpO2: 100% 99% 100% 100%   Weight:      Height:       General: alert, cooperative, and no distress Lochia: appropriate Uterine Fundus: firm Incision: N/A DVT Evaluation: No significant calf/ankle edema. Labs: Lab Results  Component Value Date   WBC 8.0 12/12/2023   HGB 9.6 (L) 12/12/2023   HCT 32.2 (L) 12/12/2023   MCV 84.3 12/12/2023   PLT 255 12/12/2023      Latest Ref Rng & Units 12/12/2023   11:39 AM  CMP  Glucose 70 - 99 mg/dL 881   BUN 6 - 20 mg/dL <5   Creatinine 9.55 - 1.00 mg/dL 9.34   Sodium 864 - 854 mmol/L 135   Potassium 3.5 - 5.1 mmol/L 3.6   Chloride 98 - 111 mmol/L 106   CO2 22 - 32 mmol/L 18   Calcium 8.9 - 10.3 mg/dL 8.8   Total Protein 6.5 - 8.1 g/dL 6.4   Total Bilirubin 0.0 - 1.2 mg/dL 0.4   Alkaline Phos 38 - 126 U/L 143   AST 15 - 41 U/L 22   ALT 0 - 44 U/L 14    Edinburgh Score:    12/14/2023    8:21 AM  Edinburgh Postnatal Depression Scale Screening Tool  I have been able to laugh and see the funny side of things. 0  I have looked forward with enjoyment to things. 0  I have blamed myself unnecessarily when things went wrong. 0  I have been anxious or worried for no good reason. 0  I have  felt scared or panicky for no good reason. 0  Things have been getting on top of me. 0  I have been so unhappy that I have had difficulty sleeping. 0  I have felt sad or miserable. 0  I have been so unhappy that I have been crying. 0  The thought of harming myself has occurred to me. 0  Edinburgh Postnatal Depression Scale Total 0   Edinburgh Postnatal Depression Scale Total: 0   After visit meds:  Allergies as of 12/15/2023   No Known Allergies      Medication List     TAKE these medications    acetaminophen  650 MG CR tablet Commonly known as: TYLENOL  Take 650 mg by mouth every 8 (eight) hours as needed for pain.   benzocaine -Menthol  20-0.5 % Aero Commonly known as: DERMOPLAST Apply 1 Application topically as needed for irritation (perineal discomfort).    ferrous sulfate  325 (65 FE) MG EC tablet Take 1 tablet (325 mg total) by mouth every other day.   ibuprofen  600 MG tablet Commonly known as: ADVIL  Take 1 tablet (600 mg total) by mouth every 6 (six) hours.   omeprazole  20 MG tablet Commonly known as: PriLOSEC  OTC Take once daily 30-60 minutes before breakfast   ondansetron  4 MG disintegrating tablet Commonly known as: Zofran  ODT Take 1 tablet (4 mg total) by mouth every 8 (eight) hours as needed for nausea or vomiting.   senna-docusate 8.6-50 MG tablet Commonly known as: Senokot-S Take 2 tablets by mouth daily.   sucralfate  1 g tablet Commonly known as: Carafate  3 times daily with meals as needed   Vitafol  Gummies 3.33-0.333-34.8 MG Chew Chew 1 tablet by mouth daily.         Discharge home in stable condition Infant Feeding: Breast Infant Disposition:home with mother Discharge instruction: per After Visit Summary and Postpartum booklet. Activity: Advance as tolerated. Pelvic rest for 6 weeks.  Diet: routine diet Future Appointments:No future appointments. Follow up Visit: Message sent to [facility] 7/16  Please schedule this patient for a In person postpartum visit in 6 weeks with the following provider: Any provider. Additional Postpartum F/U:None  Low risk pregnancy complicated by: N/A Delivery mode:  Vaginal, Spontaneous Anticipated Birth Control:  Unsure   12/15/2023 Alain Sor, MD

## 2023-12-15 NOTE — Lactation Note (Signed)
 This note was copied from a baby's chart. Lactation Consultation Note  Patient Name: Lauren Fernandez Date: 12/15/2023 Age:23 hours, P1  Reason for consult: Follow-up assessment;1st time breastfeeding;Term;Primapara;Infant weight loss;Infant < 6lbs (1 % weight  loss, milk is coming in) Per mom milk feels like it's coming in, and the most pumped off 16 ml.  LC stressed the importance of consistent pumping with every feeding whether baby goes to the breast or not.  LC plan of care for D/C:  Feed the baby with cues and by 3 hours , attempt at the breast and if to sluggish feed an appetizer 1st and then latch. If unable to latch finish the feeding with a bottle. And post pump .  If the baby latches  feed for 15 -20 mins and then work up to supplementing 30 ml per feeding.  Post pump both breast for 15-20 mins and then save milk  for the next feeding.  Mom aware she has a LC O/P on 7/30 at the [facility].   Maternal Data Has patient been taught Hand Expression?: Yes Does the patient have breastfeeding experience prior to this delivery?: No  Feeding Mother's Current Feeding Choice: Breast Milk Nipple Type: Slow - flow  LATCH Score 9-8- 6    Lactation Tools Discussed/Used Tools: Pump;Flanges Flange Size: 18;21 Breast pump type: Double-Electric Breast Pump;Manual;Other (comment) (per mom has pumped 5-6 times with hand pump and DEBP) Pump Education: Setup, frequency, and cleaning;Milk Storage Pumped volume: 15 mL  Interventions  Education. [facility] resource sheets, storage of breast milk and cleaning of the pump.   Discharge Discharge Education: Engorgement and breast care;Warning signs for feeding baby;Outpatient recommendation;Other (comment) (already scheduled for 7/30 at 1400) Pump: Manual;DEBP;Personal  Consult Status Consult Status: Complete Date: 12/15/23    Lauren Fernandez 12/15/2023, 9:44 AM

## 2023-12-21 ENCOUNTER — Encounter: Payer: PRIVATE HEALTH INSURANCE | Admitting: Family Medicine

## 2023-12-21 ENCOUNTER — Other Ambulatory Visit: Payer: PRIVATE HEALTH INSURANCE

## 2023-12-23 ENCOUNTER — Inpatient Hospital Stay (HOSPITAL_COMMUNITY)

## 2023-12-23 ENCOUNTER — Inpatient Hospital Stay (HOSPITAL_COMMUNITY): Admission: AD | Admit: 2023-12-23 | Source: Home / Self Care

## 2023-12-28 ENCOUNTER — Telehealth (HOSPITAL_COMMUNITY): Payer: Self-pay | Admitting: *Deleted

## 2023-12-28 NOTE — Telephone Encounter (Signed)
 12/28/2023  Name: Lauren Fernandez MRN: [id number] DOB: December 26, 2000  Reason for Call:  Transition of Care Hospital Discharge Call  Contact Status: Patient Contact Status: Message  Language assistant needed:          Follow-Up Questions:    [place] Postnatal Depression Scale:  In the Past 7 Days:    PHQ2-9 Depression Scale:     Discharge Follow-up:    Post-discharge interventions: NA  Mliss Sieve, RN 12/28/2023 16:06

## 2024-01-18 ENCOUNTER — Encounter: Payer: Self-pay | Admitting: Family Medicine

## 2024-01-21 ENCOUNTER — Ambulatory Visit (INDEPENDENT_AMBULATORY_CARE_PROVIDER_SITE_OTHER): Admitting: Family Medicine

## 2024-01-21 ENCOUNTER — Other Ambulatory Visit: Payer: Self-pay

## 2024-01-21 NOTE — Progress Notes (Signed)
 Post Partum Visit Note  Lauren Fernandez is a 23 y.o. G69P1001 female who presents for a postpartum visit. She is 5 weeks postpartum following a normal spontaneous vaginal delivery.  I have fully reviewed the prenatal and intrapartum course. The delivery was at [redacted]w[redacted]d gestational weeks.  Anesthesia: epidural. Postpartum course has been good. Baby is doing well. Baby is feeding by breast. Bleeding no bleeding. Bowel function is normal. Bladder function is normal. Patient is not sexually active. Contraception method is condoms. Postpartum depression screening: negative.   Upstream - 01/21/24 0951       Pregnancy Intention Screening   Does the patient want to become pregnant in the next year? No    Does the patient's partner want to become pregnant in the next year? Yes    Would the patient like to discuss contraceptive options today? No      Contraception Wrap Up   Current Method Female Condom    End Method Female Condom    Contraception Counseling Provided Yes    How was the end contraceptive method provided? Provided on site         The pregnancy intention screening data noted above was reviewed. Potential methods of contraception were discussed. The patient elected to proceed with Female Condom.   Edinburgh Postnatal Depression Scale - 01/21/24 0959       Edinburgh Postnatal Depression Scale:  In the Past 7 Days   I have been able to laugh and see the funny side of things. 0    I have looked forward with enjoyment to things. 0    I have blamed myself unnecessarily when things went wrong. 0    I have been anxious or worried for no good reason. 0    I have felt scared or panicky for no good reason. 0    Things have been getting on top of me. 0    I have been so unhappy that I have had difficulty sleeping. 0    I have felt sad or miserable. 0    I have been so unhappy that I have been crying. 0    The thought of harming myself has occurred to me. 0    Edinburgh Postnatal Depression  Scale Total 0          Health Maintenance Due  Topic Date Due   HPV VACCINES (1 - 3-dose series) Never done   Meningococcal B Vaccine (1 of 2 - Standard) Never done   DTaP/Tdap/Td (1 - Tdap) Never done   Hepatitis B Vaccines 19-59 Average Risk (1 of 3 - 19+ 3-dose series) Never done   COVID-19 Vaccine (1 - 2024-25 season) Never done   INFLUENZA VACCINE  12/31/2023    The following portions of the patient's history were reviewed and updated as appropriate: allergies, current medications, past family history, past medical history, past social history, past surgical history, and problem list.  Review of Systems Pertinent items are noted in HPI.  Objective:  BP 117/79   Pulse 79   Wt 228 lb (103.4 kg)   LMP 03/11/2023   BMI 35.71 kg/m    General:  alert, cooperative, and appears stated age   Breasts:  not indicated  Lungs: Normal work of breathing   Heart:  Regular rate   Abdomen: soft, non-tender; bowel sounds normal; no masses,  no organomegaly   Wound NA  GU exam:  not indicated       Assessment:    1. Postpartum  care and examination Normal postpartum exam.   Plan:   Essential components of care per ACOG recommendations:  1.  Mood and well being: Patient with negative depression screening today. Reviewed local resources for support.  - Patient tobacco use? No.   - hx of drug use? No.    2. Infant care and feeding:  -Patient currently breastmilk feeding? Yes. Discussed returning to work and pumping. Reviewed importance of draining breast regularly to support lactation.  -Social determinants of health (SDOH) reviewed in EPIC. No concerns  3. Sexuality, contraception and birth spacing - Patient does not want a pregnancy in the next year.  Desired family size is 4 children.  - Reviewed reproductive life planning. Reviewed contraceptive methods based on pt preferences and effectiveness.  Patient desired Withdrawal or Other Method today.   - Discussed birth spacing  of 18 months  4. Sleep and fatigue -Encouraged family/partner/community support of 4 hrs of uninterrupted sleep to help with mood and fatigue  5. Physical Recovery  - Discussed patients delivery and complications. She describes her labor as good. - Patient had a Vaginal, no problems at delivery. Patient had no laceration. Perineal healing reviewed. Patient expressed understanding - Patient has urinary incontinence? No. - Patient is safe to resume physical and sexual activity  6.  Health Maintenance - HM due items addressed Yes - Last pap smear  Diagnosis  Date Value Ref Range Status  06/08/2023   Final   - Negative for intraepithelial lesion or malignancy (NILM)   Pap smear not done at today's visit.  -Breast Cancer screening indicated? No.   7. Chronic Disease/Pregnancy Condition follow up: None  - PCP follow up  Norleen LULLA Rover, MD [facility], [facility]

## 2024-03-28 ENCOUNTER — Encounter: Payer: Self-pay | Admitting: Family Medicine

## 2024-03-29 NOTE — Telephone Encounter (Signed)
**Note De-identified  [place] Obfuscation** Please advise 

## 2024-05-18 ENCOUNTER — Encounter: Payer: Self-pay | Admitting: Family Medicine

## 2024-05-18 DIAGNOSIS — E669 Obesity, unspecified: Secondary | ICD-10-CM

## 2024-05-22 NOTE — Telephone Encounter (Signed)
 Referral for a primary care provider needed. Patient inquiring about weight loss.
